# Patient Record
Sex: Male | Born: 1995 | Race: Black or African American | Hispanic: No | Marital: Single | Smoking: Never smoker
Health system: Southern US, Community
[De-identification: ages and names within clinical notes are randomized; demographics above are authoritative.]

## PROBLEM LIST (undated history)

## (undated) DIAGNOSIS — Q059 Spina bifida, unspecified: Secondary | ICD-10-CM

## (undated) DIAGNOSIS — Z982 Presence of cerebrospinal fluid drainage device: Secondary | ICD-10-CM

## (undated) DIAGNOSIS — G8221 Paraplegia, complete: Secondary | ICD-10-CM

## (undated) DIAGNOSIS — N319 Neuromuscular dysfunction of bladder, unspecified: Secondary | ICD-10-CM

## (undated) DIAGNOSIS — M419 Scoliosis, unspecified: Secondary | ICD-10-CM

## (undated) DIAGNOSIS — K592 Neurogenic bowel, not elsewhere classified: Secondary | ICD-10-CM

## (undated) HISTORY — PX: HIP SURGERY: SHX245

## (undated) HISTORY — PX: BACK SURGERY: SHX140

## (undated) HISTORY — PX: BLADDER SURGERY: SHX569

---
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
## 2014-01-25 ENCOUNTER — Emergency Department (HOSPITAL_COMMUNITY)
Admission: EM | Admit: 2014-01-25 | Discharge: 2014-01-25 | Disposition: A | Discharge status: Home / Self Care | Payer: Medicare Other | Attending: Emergency Medicine | Admitting: Emergency Medicine

## 2014-01-25 ENCOUNTER — Emergency Department (HOSPITAL_COMMUNITY): Payer: Medicare Other

## 2014-01-25 ENCOUNTER — Encounter (HOSPITAL_COMMUNITY): Payer: Self-pay | Admitting: Cardiology

## 2014-01-25 DIAGNOSIS — Q059 Spina bifida, unspecified: Secondary | ICD-10-CM | POA: Diagnosis not present

## 2014-01-25 DIAGNOSIS — L84 Corns and callosities: Secondary | ICD-10-CM | POA: Diagnosis not present

## 2014-01-25 DIAGNOSIS — Z79899 Other long term (current) drug therapy: Secondary | ICD-10-CM | POA: Insufficient documentation

## 2014-01-25 DIAGNOSIS — Z5189 Encounter for other specified aftercare: Secondary | ICD-10-CM

## 2014-01-25 DIAGNOSIS — Z792 Long term (current) use of antibiotics: Secondary | ICD-10-CM | POA: Diagnosis not present

## 2014-01-25 DIAGNOSIS — M79671 Pain in right foot: Secondary | ICD-10-CM | POA: Diagnosis present

## 2014-01-25 MED ORDER — SULFAMETHOXAZOLE-TRIMETHOPRIM 800-160 MG PO TABS: 1.0000 | ORAL_TABLET | Freq: Two times a day (BID) | ORAL | Status: DC

## 2014-01-25 NOTE — ED Notes (Signed)
Has a callus to right foot that has busted open.

## 2014-01-25 NOTE — ED Provider Notes (Signed)
CSN: [id number]     Arrival date & time 01/25/14  1052 History  This chart was scribed for non-physician practitioner Burgess AmorJulie Kayah Hecker, PA-C working with James LennertJoseph L Zammit, MD by Murriel HopperAlec Bankhead, ED Scribe. This patient was seen in room APFT22/APFT22 and the patient's care was started at 12:53 PM.    Chief Complaint  Patient presents with  . Foot Pain    The history is provided by the patient. No language interpreter was used.    HPI Comments: James Phoenixicholas Hayne is a 18 y.o. male who presents to the Emergency Department complaining of constant right foot pain on a callus with associated bleeding that started yesterday. He wears braces on his legs due to spina bifida with resultant lower extremity weakness.  Pt notes that he always has a callous on this foot, but says that it has never looked this bad or bled before. There has been no purulent drainage and he reports having no sensation in his legs, therefore denies pain. Pt notes that he [place] a [place]iatrist in [place], and that they trim down his callous a few times per year. Pt denies fever, nausea, and vomiting. Pt denies any existing medical problems or being allergic to any medications. 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    Past Medical History  Diagnosis Date  . Spinal bifida, closed    History reviewed. No pertinent past surgical history. History reviewed. No pertinent family history. History  Substance Use Topics  . Smoking status: Never Smoker   . Smokeless tobacco: Not on file  . Alcohol Use: No    Review of Systems  Constitutional: Negative for fever.  Musculoskeletal: Negative for myalgias and arthralgias.  Skin: Positive for wound.  Neurological: Negative for weakness and numbness.      Allergies  Latex and Peanut-containing drug products  Home Medications   Prior to Admission medications   Medication Sig Start Date End Date Taking? Authorizing Provider  oxybutynin (DITROPAN) 5 MG tablet Take 5 mg by mouth 3 (three)  times daily.   Yes Historical Provider, MD  sulfamethoxazole-trimethoprim (SEPTRA DS) 800-160 MG per tablet Take 1 tablet by mouth every 12 (twelve) hours. 01/25/14   Burgess AmorJulie Hernando Reali, PA-C   BP 132/80 mmHg  Pulse 78  Temp(Src) 98.9 F (37.2 C) (Oral)  Resp 16  Wt 150 lb (68.04 kg)  SpO2 100% Physical Exam  Constitutional: He appears well-developed and well-nourished.  HENT:  Head: Atraumatic.  Neck: Normal range of motion.  Cardiovascular:  Pulses equal bilaterally  Musculoskeletal: He exhibits no edema.  Neurological: He is alert. He has normal strength. He displays normal reflexes. No sensory deficit.  Skin: Skin is warm and dry.  2 cm raised callous  Right medial ankle Fissure with surrounding dried blood No fluctuance or erythema Indurated border with central loose scab Normal dorsalis pedis pulses  Psychiatric: He has a normal mood and affect.    ED Course  Procedures (including critical care time)  DIAGNOSTIC STUDIES: Oxygen Saturation is 100% on RA, normal by my interpretation.    COORDINATION OF CARE: 12:58 PM Discussed treatment plan with pt at bedside and pt agreed to plan.   Labs Review Labs Reviewed - No data to display  Imaging Review Dg Foot Complete Right  01/25/2014   CLINICAL DATA:  18 year old male with spontaneous draining from a chronic callus area at the medial aspect of his right midfoot. Clinical history of spina bifida with no sensation or use of the right foot/extremity.  EXAM: RIGHT  FOOT COMPLETE - 3+ VIEW  COMPARISON:  None.  FINDINGS: No evidence of acute fracture or malalignment. Mild diffuse disuse osteopenia. Hammertoe deformity of the first and second toe. Chronic pes planus. Focal soft tissue swelling along the medial aspect of the foot overlying the tarsal navicular. There appear to be is some linear internal lucencies which may represent subcutaneous emphysema. The no erosive or bony changes in the adjacent osseous structures.  IMPRESSION: 1.  Focal rounded soft tissue swelling with internal linear lucencies overlying the medial aspect of the midfoot at the level of the tarsal navicular. The internal linear lucencies may represent subcutaneous emphysema. Differential considerations include soft tissue infection and potentially epidermoid cyst. 2. Disuse osteopenia. 3. First and second hammertoe deformities. 4. Pes planus.   Electronically Signed   By: Malachy MoanHeath  McCullough M.D.   On: 01/25/2014 12:47     EKG Interpretation None      MDM   Final diagnoses:  Wound check, abscess  Pre-ulcerative corn or callous    Pt encouraged warm epsom salt soaks bid, prescribed septra. F/u with podiatry this week. Pt to call for appt.  Suspect central scab will slough with soaking, will probably require debridement of calloused border.  I personally performed the services described in this documentation, which was scribed in my presence. The recorded information has been reviewed and is accurate.   Burgess AmorJulie Maham Quintin, PA-C 01/26/14 [id number]  James LennertJoseph L Zammit, MD 01/26/14 [phone]

## 2014-01-25 NOTE — Discharge Instructions (Signed)
Corns and Calluses A thickening of the skin layer (usually over bony areas, such as toe joints) is known as a corn. Two types of corns exist: hard corns and soft corns. Calluses are painless areas of skin thickening that are caused by repeated pressure or irritation. Corns tend to affect toe joints and the skin between the toes; whereas, a callus can appear on any part of the body (especially the hands, feet, or knees).  SYMPTOMS   Corn:  Presence of a small (1/8 to 3/8 inch [3 to 10 mm in diameter]), painful bump on the side or over the joint of a toe.  Hard corns are more common on the outer portion of the little (fifth) toe at the joint.  Soft corns are more common between bony bumps (prominences), usually between the fourth and fifth toes or between the second and third toes.  Callus:  A rough, thickened area of skin that appears after repeated pressure or irritation. CAUSES  The purpose of corns and calluses is to protect an area of skin from injury caused by repeated irritation (rubbing or squeezing). The presence of pressure causes the skin cells to grow at a faster rate than the cells of unaffected areas. This leads to an overgrowth (corn or callus). As apposed to hard corns, soft corns tend to develop between toes, because there is more moisture. Soft corns are often the result of prolonged shoe wear, which leads to increased perspiration and moisture.  RISK INCREASES WITH:  Shoes that are too tight.  Occupations or sports that involve repetitive pressure on the hands (racquetball and baseball) or sudden stops on hard surfaces (track and tennis).  Sports that require the athlete to wear shoes, perspire, or wear clothing or protective gear that causes the production of heat and friction. PREVENTION  Properly fitted shoes and equipment.  Modify activities to prevent constant pressure on specific areas of skin.  If possible, wear padding over areas of skin that are exposed to  repeated pressure or irritation.  Keep the area between the toes dry (with powder or by removing shoes often).  Relieve shoe pressure by stretching the areas of the shoe that cause the pressure and or use ointments to soften leather shoes. PROGNOSIS  Corns and calluses typically subside if the activity that causes them is eliminated. Recovery may take up to 3 weeks. Recurrence is likely even with treatment if the cause is not removed.  RELATED COMPLICATIONS  If one overcompensates in an attempt to avoid pain, he or she may experience pain in other areas due to the changes in body movements (mechanics). TREATMENT  The best way to treat corns and calluses is to remove the source of pressure. Corn and callus pads may be helpful in reducing pressure on the affected skin. For soft corns, try to keep the affected area dry. If you cannot find shoes that fit properly, a shoe repair shop may be able to alter your shoes to reduce pressure. Occasionally a cushion for the bottom of the foot (metatarsal bar) worn within the shoe may relieve pressure on corns or calluses of the foot. For calluses, you may be able to peel or rub the thickened area with a pumice stone, sandstone, callus file, or with sandpaper to remove the callus; wetting the affected area may make this process more effective. Do not cut the corn or callus with a razor or knife. If the corn or callus must be removed, then a medically trained person should perform  the procedure. After peeling away the upper layers of a corn once or twice a day, it may be recommended to apply a non-prescription 5% to 10% salicylic ointment and cover the area with a bandage. It very uncommon to have the bony bumps (at toe joints) surgically removed. MEDICATION   If pain medication is necessary, nonsteroidal anti-inflammatory medications, such as aspirin and ibuprofen, or other minor pain relievers, such as acetaminophen, are often recommended. Contact your caregiver  immediately if any bleeding, stomach upset, or signs of an allergic reaction occur.  Topical salicylic ointments (5% to 10%) may be of benefit.  Prescription pain medications may be given by a caregiver. Use only as directed and only as much as you need.  Soak the foot for 20 minutes, twice a day, in a gallon of warm water. This may help to soften corns and calluses. Care should be taken to thoroughly dry the foot, especially between the toes, after soaking. SEEK MEDICAL CARE IF:   Symptoms get worse or do not improve in 2 weeks despite treatment.  Any signs of infection develop, including redness, swelling, increased pain or tenderness, or increased warmth around the corn or callus.  New, unexplained symptoms develop (drugs used in treatment may produce side effects). Document Released: 02/27/2005 Document Revised: 05/22/2011 Document Reviewed: 06/11/2008 [facility] Patient Information 2015 [place] [place]. This information is not intended to replace advice given to you by your health care provider. Make sure you discuss any questions you have with your health care provider.  You have been prescribed an antibiotic, although this site is not clearly infected.  Complete a 10 minute warm epsom salt soak twice daily.  Call your podiatrist for a recheck of your foot this week.

## 2014-01-25 NOTE — ED Notes (Signed)
Julie, PA at bedside.

## 2014-03-12 ENCOUNTER — Emergency Department (HOSPITAL_COMMUNITY)
Admission: EM | Admit: 2014-03-12 | Discharge: 2014-03-12 | Disposition: A | Discharge status: Home / Self Care | Payer: Medicare Other | Attending: Emergency Medicine | Admitting: Emergency Medicine

## 2014-03-12 ENCOUNTER — Encounter (HOSPITAL_COMMUNITY): Payer: Self-pay | Admitting: Cardiology

## 2014-03-12 ENCOUNTER — Ambulatory Visit (HOSPITAL_COMMUNITY): Admission: RE | Admit: 2014-03-12 | Payer: Medicare Other | Source: Ambulatory Visit

## 2014-03-12 ENCOUNTER — Emergency Department (HOSPITAL_COMMUNITY): Payer: Medicare Other

## 2014-03-12 DIAGNOSIS — R52 Pain, unspecified: Secondary | ICD-10-CM

## 2014-03-12 DIAGNOSIS — M7989 Other specified soft tissue disorders: Secondary | ICD-10-CM | POA: Diagnosis not present

## 2014-03-12 DIAGNOSIS — Z79899 Other long term (current) drug therapy: Secondary | ICD-10-CM | POA: Diagnosis not present

## 2014-03-12 DIAGNOSIS — L989 Disorder of the skin and subcutaneous tissue, unspecified: Secondary | ICD-10-CM | POA: Insufficient documentation

## 2014-03-12 DIAGNOSIS — Z91040 Latex allergy status: Secondary | ICD-10-CM | POA: Diagnosis not present

## 2014-03-12 DIAGNOSIS — Q059 Spina bifida, unspecified: Secondary | ICD-10-CM | POA: Diagnosis not present

## 2014-03-12 MED ORDER — IBUPROFEN 800 MG PO TABS: 800.0000 mg | ORAL_TABLET | Freq: Three times a day (TID) | ORAL | Status: DC

## 2014-03-12 NOTE — ED Provider Notes (Signed)
CSN: [id number]     Arrival date & time 03/12/14  1105 History  This chart was scribed for James OctaveStephen Selassie Spatafore, MD by Abel PrestoKara Demonbreun, ED Scribe. This patient was seen in room APA03/APA03 and the patient's care was started at 11:34 AM.    Chief Complaint  Patient presents with  . Leg Swelling    The history is provided by the patient and a relative. No language interpreter was used.   HPI Comments: James Kirk is a 18 y.o. male with PMHx of spina bifida who presents to the Emergency Department complaining of right leg swelling with onset yesterday.  Pt notes associated pain in his right knee and . Pt has folliculitis scabs on both legs. Pt denies any other medical problems. Pt denies chest pain, SOB, abdominal pain, fevers, vomiting, fall or injury, and PMHx of blood clot. 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  [facility]   Past Medical History  Diagnosis Date  . Spinal bifida, closed    History reviewed. No pertinent past surgical history. History reviewed. No pertinent family history. History  Substance Use Topics  . Smoking status: Never Smoker   . Smokeless tobacco: Not on file  . Alcohol Use: No    Review of Systems  Constitutional: Negative for fever.  Respiratory: Negative for shortness of breath.   Cardiovascular: Negative for chest pain.  Gastrointestinal: Negative for nausea, vomiting and abdominal pain.  Musculoskeletal: Positive for myalgias.  Skin: Positive for rash.   A complete 10 system review of systems was obtained and all systems are negative except as noted in the HPI and PMH.     Allergies  Latex and Peanut-containing drug products  Home Medications   Prior to Admission medications   Medication Sig Start Date End Date Taking? Authorizing Provider  oxybutynin (DITROPAN) 5 MG tablet Take 5 mg by mouth 3 (three) times daily.   Yes Historical Provider, MD  ibuprofen (ADVIL,MOTRIN) 800 MG tablet Take 1 tablet (800 mg  total) by mouth 3 (three) times daily. 03/12/14   James OctaveStephen Ved Martos, MD  sulfamethoxazole-trimethoprim (SEPTRA DS) 800-160 MG per tablet Take 1 tablet by mouth every 12 (twelve) hours. Patient not taking: Reported on 03/12/2014 01/25/14   [name], PA-C   BP 135/85 mmHg  Pulse 85  Temp(Src) 98.3 F (36.8 C) (Oral)  Resp 20  Wt 150 lb (68.04 kg)  SpO2 100% Physical Exam  Constitutional: He is oriented to person, place, and time. He appears well-developed and well-nourished. No distress.  HENT:  Head: Normocephalic and atraumatic.  Mouth/Throat: Oropharynx is clear and moist. No oropharyngeal exudate.  Eyes: Conjunctivae and EOM are normal. Pupils are equal, round, and reactive to light.  Neck: Normal range of motion. Neck supple.  No meningismus.  Cardiovascular: Normal rate, regular rhythm, normal heart sounds and intact distal pulses.   No murmur heard. Intact DP and PT pulses  Pulmonary/Chest: Effort normal and breath sounds normal. No respiratory distress.  Abdominal: Soft. There is no tenderness. There is no rebound and no guarding.  Musculoskeletal: Normal range of motion. He exhibits tenderness. He exhibits no edema.       Right knee: Tenderness (posterior knee and proximal calf) found.  Bilateral lower extremity atrophy and weakness consistent with spinal bifida Right leg appears larger than left  Neurological: He is alert and oriented to person, place, and time. No cranial nerve deficit. He exhibits normal muscle tone. Coordination normal.  Lower extremity weakness at  baseline  Skin: Skin is warm. Lesion (Multiple old appearing scab lesions to bilateral legs) noted.  Psychiatric: He has a normal mood and affect. His behavior is normal.  Nursing note and vitals reviewed.   ED Course  Procedures (including critical care time) DIAGNOSTIC STUDIES: Oxygen Saturation is 99% on room air, normal by my interpretation.    COORDINATION OF CARE: 11:38 AM Discussed treatment plan  with patient at beside, the patient agrees with the plan and has no further questions at this time.   Labs Review Labs Reviewed - No data to display  Imaging Review [place] Venous Img Lower Unilateral Right  03/12/2014   CLINICAL DATA:  Right lower extremity pain and edema. History of spina bifida.  EXAM: RIGHT LOWER EXTREMITY VENOUS DOPPLER ULTRASOUND  TECHNIQUE: Gray-scale sonography with graded compression, as well as color Doppler and duplex ultrasound were performed to evaluate the lower extremity deep venous systems from the level of the common femoral vein and including the common femoral, femoral, profunda femoral, popliteal and calf veins including the posterior tibial, peroneal and gastrocnemius veins when visible. The superficial great saphenous vein was also interrogated. Spectral Doppler was utilized to evaluate flow at rest and with distal augmentation maneuvers in the common femoral, femoral and popliteal veins.  COMPARISON:  None.  FINDINGS: Contralateral Common Femoral Vein: Respiratory phasicity is normal and symmetric with the symptomatic side. No evidence of thrombus. Normal compressibility.  Common Femoral Vein: No evidence of thrombus. Normal compressibility, respiratory phasicity and response to augmentation.  Saphenofemoral Junction: No evidence of thrombus. Normal compressibility and flow on color Doppler imaging.  Profunda Femoral Vein: No evidence of thrombus. Normal compressibility and flow on color Doppler imaging.  Femoral Vein: No evidence of thrombus. Normal compressibility, respiratory phasicity and response to augmentation.  Popliteal Vein: No evidence of thrombus. Normal compressibility, respiratory phasicity and response to augmentation.  Calf Veins: No evidence of thrombus. Normal compressibility and flow on color Doppler imaging.  Superficial Great Saphenous Vein: No evidence of thrombus. Normal compressibility and flow on color Doppler imaging.  Venous Reflux:  None.  Other  Findings: No evidence of superficial thrombophlebitis or abnormal fluid collection.  IMPRESSION: No evidence of right lower extremity deep venous thrombosis.   Electronically Signed   By: Irish LackGlenn  Yamagata M.D.   On: 03/12/2014 13:45     EKG Interpretation None      MDM   Final diagnoses:  Leg swelling  Pain   2 day history of right leg swelling. Denies trauma. History of spina bifida and is mostly wheelchair-bound. No chest pain or SOB. Intact distal pulses.  Ultrasound negative for DVT. Patient and mother deny any trauma and refuse Xray.  Consider given patient's lack of sensation in leg at baseline.  Supportive care and follow up with PCP.  James OctaveStephen Abdulah Iqbal, MD 03/12/14 [phone]

## 2014-03-12 NOTE — ED Notes (Signed)
Ultrasound at bedside

## 2014-03-12 NOTE — Discharge Instructions (Signed)
Peripheral Edema There is no blood clot. Take anti-inflammatory medication as prescribed. Follow-up with your doctor. Return to the ED if you develop chest pain, shortness of breath or other symptoms. You have swelling in your legs (peripheral edema). This swelling is due to excess accumulation of salt and water in your body. Edema may be a sign of heart, kidney or liver disease, or a side effect of a medication. It may also be due to problems in the leg veins. Elevating your legs and using special support stockings may be very helpful, if the cause of the swelling is due to poor venous circulation. Avoid long periods of standing, whatever the cause. Treatment of edema depends on identifying the cause. Chips, pretzels, pickles and other salty foods should be avoided. Restricting salt in your diet is almost always needed. Water pills (diuretics) are often used to remove the excess salt and water from your body via urine. These medicines prevent the kidney from reabsorbing sodium. This increases urine flow. Diuretic treatment may also result in lowering of potassium levels in your body. Potassium supplements may be needed if you have to use diuretics daily. Daily weights can help you keep track of your progress in clearing your edema. You should call your caregiver for follow up care as recommended. SEEK IMMEDIATE MEDICAL CARE IF:   You have increased swelling, pain, redness, or heat in your legs.  You develop shortness of breath, especially when lying down.  You develop chest or abdominal pain, weakness, or fainting.  You have a fever. Document Released: 04/06/2004 Document Revised: 05/22/2011 Document Reviewed: 03/17/2009 [facility] Patient Information 2015 [place], [place]. This information is not intended to replace advice given to you by your health care provider. Make sure you discuss any questions you have with your health care provider.

## 2014-03-12 NOTE — ED Notes (Signed)
Patient states he did not fall or injure leg and does not want to have an xray done. EDP aware.

## 2014-03-12 NOTE — ED Notes (Signed)
Right leg swelling since yesterday

## 2014-07-26 ENCOUNTER — Encounter (HOSPITAL_COMMUNITY): Payer: Self-pay | Admitting: Emergency Medicine

## 2014-07-26 ENCOUNTER — Emergency Department (HOSPITAL_COMMUNITY): Payer: Medicare Other

## 2014-07-26 ENCOUNTER — Emergency Department (HOSPITAL_COMMUNITY)
Admission: EM | Admit: 2014-07-26 | Discharge: 2014-07-26 | Disposition: A | Discharge status: Home / Self Care | Payer: Medicare Other | Attending: Emergency Medicine | Admitting: Emergency Medicine

## 2014-07-26 DIAGNOSIS — Z87728 Personal history of other specified (corrected) congenital malformations of nervous system and sense organs: Secondary | ICD-10-CM | POA: Diagnosis not present

## 2014-07-26 DIAGNOSIS — Z91040 Latex allergy status: Secondary | ICD-10-CM | POA: Diagnosis not present

## 2014-07-26 DIAGNOSIS — J069 Acute upper respiratory infection, unspecified: Secondary | ICD-10-CM | POA: Diagnosis not present

## 2014-07-26 DIAGNOSIS — M791 Myalgia: Secondary | ICD-10-CM | POA: Insufficient documentation

## 2014-07-26 DIAGNOSIS — J189 Pneumonia, unspecified organism: Secondary | ICD-10-CM

## 2014-07-26 DIAGNOSIS — Z79899 Other long term (current) drug therapy: Secondary | ICD-10-CM | POA: Insufficient documentation

## 2014-07-26 DIAGNOSIS — R0981 Nasal congestion: Secondary | ICD-10-CM | POA: Diagnosis present

## 2014-07-26 DIAGNOSIS — J159 Unspecified bacterial pneumonia: Secondary | ICD-10-CM | POA: Diagnosis not present

## 2014-07-26 MED ORDER — AZITHROMYCIN 250 MG PO TABS
250.0000 mg | ORAL_TABLET | Freq: Every day | ORAL | Status: DC
Start: 2014-07-26 — End: 2016-03-09

## 2014-07-26 MED ORDER — BENZONATATE 100 MG PO CAPS: 100.0000 mg | ORAL_CAPSULE | Freq: Three times a day (TID) | ORAL | Status: DC | PRN

## 2014-07-26 MED ORDER — AZITHROMYCIN 250 MG PO TABS
500.0000 mg | ORAL_TABLET | Freq: Once | ORAL | Status: AC
  Administered 2014-07-26: 500 mg via ORAL
  Filled 2014-07-26: qty 2

## 2014-07-26 NOTE — ED Notes (Signed)
PT c/o nasal congestion, sorethroat, body aches with dry cough x1 week.

## 2014-07-26 NOTE — Discharge Instructions (Signed)
Pneumonia, Adult Pneumonia is an infection of the lungs. It may be caused by a germ (virus or bacteria). Some types of pneumonia can spread easily from person to person. This can happen when you cough or sneeze. HOME CARE  Only take medicine as told by your doctor.  Take your medicine (antibiotics) as told. Finish it even if you start to feel better.  Do not smoke.  You may use a vaporizer or humidifier in your room. This can help loosen thick spit (mucus).  Sleep so you are almost sitting up (semi-upright). This helps reduce coughing.  Rest. A shot (vaccine) can help prevent pneumonia. Shots are often advised for:  People over 21 years old.  Patients on chemotherapy.  People with long-term (chronic) lung problems.  People with immune system problems. GET HELP RIGHT AWAY IF:   You are getting worse.  You cannot control your cough, and you are losing sleep.  You cough up blood.  Your pain gets worse, even with medicine.  You have a fever.  Any of your problems are getting worse, not better.  You have shortness of breath or chest pain. MAKE SURE YOU:   Understand these instructions.  Will watch your condition.  Will get help right away if you are not doing well or get worse. Document Released: 08/16/2007 Document Revised: 05/22/2011 Document Reviewed: 05/20/2010 [facility] Patient Information 2015 [employer], [place]. This information is not intended to replace advice given to you by your health care provider. Make sure you discuss any questions you have with your health care provider.  Upper Respiratory Infection, Adult An upper respiratory infection (URI) is also known as the common cold. It is often caused by a type of germ (virus). Colds are easily spread (contagious). You can pass it to others by kissing, coughing, sneezing, or drinking out of the same glass. Usually, you get better in 1 or 2 weeks.  HOME CARE   Only take medicine as told by your doctor.  Use a  warm mist humidifier or breathe in steam from a hot shower.  Drink enough water and fluids to keep your pee (urine) clear or pale yellow.  Get plenty of rest.  Return to work when your temperature is back to normal or as told by your doctor. You may use a face mask and wash your hands to stop your cold from spreading. GET HELP RIGHT AWAY IF:   After the first few days, you feel you are getting worse.  You have questions about your medicine.  You have chills, shortness of breath, or brown or red spit (mucus).  You have yellow or brown snot (nasal discharge) or pain in the face, especially when you bend forward.  You have a fever, puffy (swollen) neck, pain when you swallow, or white spots in the back of your throat.  You have a bad headache, ear pain, sinus pain, or chest pain.  You have a high-pitched whistling sound when you breathe in and out (wheezing).  You have a lasting cough or cough up blood.  You have sore muscles or a stiff neck. MAKE SURE YOU:   Understand these instructions.  Will watch your condition.  Will get help right away if you are not doing well or get worse. Document Released: 08/16/2007 Document Revised: 05/22/2011 Document Reviewed: 06/04/2013 [facility] Patient Information 2015 [place], [place]. This information is not intended to replace advice given to you by your health care provider. Make sure you discuss any questions you have with your  health care provider. ° °

## 2014-07-26 NOTE — ED Provider Notes (Signed)
CSN: [id number]     Arrival date & time 07/26/14  1024 History   First MD Initiated Contact with Patient 07/26/14 1043     Chief Complaint  Patient presents with  . URI     (Consider location/radiation/quality/duration/timing/severity/associated sxs/prior Treatment) HPI  James Kirk is a 19 y.o. male who presents to the Emergency Department complaining of nasal congestion, sore throat, general body aches and cough for one week.  States the cough has been non-productive and worse at night, runny nose and sore throat.  He has been using OTC cold medications without relief. Denies chest pain, shortness of breath, fever, chills.     Past Medical History  Diagnosis Date  . Spinal bifida, closed    Past Surgical History  Procedure Laterality Date  . Back surgery     History reviewed. No pertinent family history. History  Substance Use Topics  . Smoking status: Never Smoker   . Smokeless tobacco: Not on file  . Alcohol Use: No    Review of Systems  Constitutional: Negative for fever, chills and appetite change.  HENT: Positive for congestion, rhinorrhea and sore throat. Negative for ear pain and trouble swallowing.   Respiratory: Positive for cough. Negative for chest tightness, shortness of breath and wheezing.   Cardiovascular: Negative for chest pain.  Gastrointestinal: Negative for nausea, vomiting and abdominal pain.  Genitourinary: Negative for dysuria and frequency.  Musculoskeletal: Positive for myalgias. Negative for back pain, arthralgias, neck pain and neck stiffness.  Skin: Negative for rash.  Neurological: Negative for dizziness, weakness and numbness.  Hematological: Negative for adenopathy.  All other systems reviewed and are negative.     Allergies  Latex and Peanut-containing drug products  Home Medications   Prior to Admission medications   Medication Sig Start Date End Date Taking? Authorizing Provider  cetirizine (ZYRTEC) 10 MG tablet Take 10  mg by mouth daily.   Yes Historical Provider, MD  oxybutynin (DITROPAN-XL) 5 MG 24 hr tablet  07/13/14  Yes Historical Provider, MD  ibuprofen (ADVIL,MOTRIN) 800 MG tablet Take 1 tablet (800 mg total) by mouth 3 (three) times daily. Patient not taking: Reported on 07/26/2014 03/12/14   Glynn OctaveStephen Rancour, MD  sulfamethoxazole-trimethoprim (SEPTRA DS) 800-160 MG per tablet Take 1 tablet by mouth every 12 (twelve) hours. Patient not taking: Reported on 03/12/2014 01/25/14   [name], PA-C   BP 164/102 mmHg  Pulse 114  Temp(Src) 97.9 F (36.6 C) (Axillary)  Resp 16  Wt 150 lb (68.04 kg)  SpO2 97% Physical Exam  Constitutional: He is oriented to person, place, and time. He appears well-developed and well-nourished. No distress.  HENT:  Head: Normocephalic and atraumatic.  Mouth/Throat: Uvula is midline. Mucous membranes are dry. Posterior oropharyngeal erythema present. No oropharyngeal exudate, posterior oropharyngeal edema or tonsillar abscesses.  Cardiovascular: Normal rate, regular rhythm, normal heart sounds and intact distal pulses.   No murmur heard. Pulmonary/Chest: Effort normal and breath sounds normal. No respiratory distress.  Musculoskeletal: Normal range of motion.  Neurological: He is alert and oriented to person, place, and time. Coordination normal.  Skin: Skin is warm and dry.  Nursing note and vitals reviewed.   ED Course  Procedures (including critical care time) Labs Review Labs Reviewed - No data to display  Imaging Review Dg Chest 2 View  07/26/2014   CLINICAL DATA:  Cough and congestion.  EXAM: CHEST  2 VIEW  COMPARISON:  None.  FINDINGS: The cardiomediastinal silhouette is unremarkable.  Left lower lobe airspace opacities  compatible with pneumonia.  There is no evidence of pneumothorax, pleural effusion, nodule, mass or acute bony abnormality.  Spinal fixation rods and thoracolumbar scoliosis identified.  IMPRESSION: Left lower lobe airspace disease/ pneumonia.    Electronically Signed   By: Harmon PierJeffrey  Hu M.D.   On: 07/26/2014 11:50     EKG Interpretation None      MDM   Final diagnoses:  Community acquired pneumonia  URI (upper respiratory infection)    Pt is well appearing, non-toxic  VSS.  Clinical suspicion for PE is low.    Pt has drank po fluids.  Feeling better.  Will treat with antibiotic and he agrees to close f/u with PMD. Given strict return precautions.     Rosey Bathammy Terecia Plaut, PA-C 07/27/14 2108  Glynn OctaveStephen Rancour, MD 07/28/14 1000

## 2014-07-26 NOTE — ED Notes (Signed)
Pt tolerating po fluids well, pt and family updated on plan of care,

## 2014-09-03 DIAGNOSIS — N319 Neuromuscular dysfunction of bladder, unspecified: Secondary | ICD-10-CM | POA: Diagnosis present

## 2014-09-03 DIAGNOSIS — K592 Neurogenic bowel, not elsewhere classified: Secondary | ICD-10-CM | POA: Insufficient documentation

## 2015-09-30 ENCOUNTER — Ambulatory Visit (HOSPITAL_COMMUNITY)
Admission: RE | Admit: 2015-09-30 | Discharge: 2015-09-30 | Disposition: A | Discharge status: Home / Self Care | Payer: Medicare Other | Source: Ambulatory Visit | Attending: Internal Medicine | Admitting: Internal Medicine

## 2015-09-30 ENCOUNTER — Other Ambulatory Visit (HOSPITAL_COMMUNITY): Payer: Self-pay | Admitting: Internal Medicine

## 2015-09-30 DIAGNOSIS — R600 Localized edema: Secondary | ICD-10-CM | POA: Insufficient documentation

## 2016-03-09 ENCOUNTER — Inpatient Hospital Stay (HOSPITAL_COMMUNITY)
Admission: EM | Admit: 2016-03-09 | Discharge: 2016-03-11 | DRG: 872 | Disposition: A | Discharge status: Home / Self Care | Payer: Medicare Other | Attending: Internal Medicine | Admitting: Internal Medicine

## 2016-03-09 ENCOUNTER — Emergency Department (HOSPITAL_COMMUNITY): Payer: Medicare Other

## 2016-03-09 ENCOUNTER — Encounter (HOSPITAL_COMMUNITY): Payer: Self-pay | Admitting: *Deleted

## 2016-03-09 DIAGNOSIS — E876 Hypokalemia: Secondary | ICD-10-CM | POA: Diagnosis present

## 2016-03-09 DIAGNOSIS — N3000 Acute cystitis without hematuria: Secondary | ICD-10-CM | POA: Diagnosis present

## 2016-03-09 DIAGNOSIS — A419 Sepsis, unspecified organism: Secondary | ICD-10-CM | POA: Diagnosis not present

## 2016-03-09 DIAGNOSIS — K5901 Slow transit constipation: Secondary | ICD-10-CM | POA: Diagnosis not present

## 2016-03-09 DIAGNOSIS — K5909 Other constipation: Secondary | ICD-10-CM | POA: Diagnosis present

## 2016-03-09 DIAGNOSIS — Q051 Thoracic spina bifida with hydrocephalus: Secondary | ICD-10-CM

## 2016-03-09 DIAGNOSIS — N319 Neuromuscular dysfunction of bladder, unspecified: Secondary | ICD-10-CM

## 2016-03-09 DIAGNOSIS — E872 Acidosis: Secondary | ICD-10-CM | POA: Diagnosis present

## 2016-03-09 DIAGNOSIS — Q054 Unspecified spina bifida with hydrocephalus: Secondary | ICD-10-CM | POA: Diagnosis not present

## 2016-03-09 DIAGNOSIS — Z23 Encounter for immunization: Secondary | ICD-10-CM

## 2016-03-09 DIAGNOSIS — Z91040 Latex allergy status: Secondary | ICD-10-CM | POA: Diagnosis not present

## 2016-03-09 DIAGNOSIS — Z982 Presence of cerebrospinal fluid drainage device: Secondary | ICD-10-CM

## 2016-03-09 DIAGNOSIS — Z91010 Allergy to peanuts: Secondary | ICD-10-CM

## 2016-03-09 DIAGNOSIS — B9620 Unspecified Escherichia coli [E. coli] as the cause of diseases classified elsewhere: Secondary | ICD-10-CM | POA: Diagnosis present

## 2016-03-09 DIAGNOSIS — Q059 Spina bifida, unspecified: Secondary | ICD-10-CM

## 2016-03-09 DIAGNOSIS — Q052 Lumbar spina bifida with hydrocephalus: Secondary | ICD-10-CM

## 2016-03-09 DIAGNOSIS — G8221 Paraplegia, complete: Secondary | ICD-10-CM | POA: Diagnosis not present

## 2016-03-09 DIAGNOSIS — R1013 Epigastric pain: Secondary | ICD-10-CM | POA: Diagnosis not present

## 2016-03-09 HISTORY — DX: Slow transit constipation: K59.01

## 2016-03-09 HISTORY — DX: Paraplegia, complete: G82.21

## 2016-03-09 HISTORY — DX: Neurogenic bowel, not elsewhere classified: K59.2

## 2016-03-09 HISTORY — DX: Presence of cerebrospinal fluid drainage device: Z98.2

## 2016-03-09 HISTORY — DX: Neuromuscular dysfunction of bladder, unspecified: N31.9

## 2016-03-09 HISTORY — DX: Scoliosis, unspecified: M41.9

## 2016-03-09 HISTORY — DX: Acute cystitis without hematuria: N30.00

## 2016-03-09 HISTORY — DX: Hypokalemia: E87.6

## 2016-03-09 LAB — I-STAT CHEM 8, ED
BUN: 14 mg/dL (ref 6–20)
Calcium, Ion: 1.09 mmol/L — ABNORMAL LOW (ref 1.15–1.40)
Chloride: 104 mmol/L (ref 101–111)
Creatinine, Ser: 0.7 mg/dL (ref 0.61–1.24)
GLUCOSE: 72 mg/dL (ref 65–99)
HCT: 48 % (ref 39.0–52.0)
HEMOGLOBIN: 16.3 g/dL (ref 13.0–17.0)
POTASSIUM: 3.5 mmol/L (ref 3.5–5.1)
SODIUM: 138 mmol/L (ref 135–145)
TCO2: 26 mmol/L (ref 0–100)

## 2016-03-09 LAB — URINALYSIS, MICROSCOPIC (REFLEX)

## 2016-03-09 LAB — CBC
HCT: 45.1 % (ref 39.0–52.0)
HEMOGLOBIN: 15.4 g/dL (ref 13.0–17.0)
MCH: 30.4 pg (ref 26.0–34.0)
MCHC: 34.1 g/dL (ref 30.0–36.0)
MCV: 89.1 fL (ref 78.0–100.0)
Platelets: 133 10*3/uL — ABNORMAL LOW (ref 150–400)
RBC: 5.06 MIL/uL (ref 4.22–5.81)
RDW: 13 % (ref 11.5–15.5)
WBC: 24.4 10*3/uL — AB (ref 4.0–10.5)

## 2016-03-09 LAB — COMPREHENSIVE METABOLIC PANEL
ALT: 19 U/L (ref 17–63)
ANION GAP: 12 (ref 5–15)
AST: 18 U/L (ref 15–41)
Albumin: 4.8 g/dL (ref 3.5–5.0)
Alkaline Phosphatase: 112 U/L (ref 38–126)
BUN: 13 mg/dL (ref 6–20)
CALCIUM: 9.6 mg/dL (ref 8.9–10.3)
CHLORIDE: 99 mmol/L — AB (ref 101–111)
CO2: 25 mmol/L (ref 22–32)
Creatinine, Ser: 0.7 mg/dL (ref 0.61–1.24)
Glucose, Bld: 102 mg/dL — ABNORMAL HIGH (ref 65–99)
Potassium: 3.2 mmol/L — ABNORMAL LOW (ref 3.5–5.1)
SODIUM: 136 mmol/L (ref 135–145)
Total Bilirubin: 1.1 mg/dL (ref 0.3–1.2)
Total Protein: 8.9 g/dL — ABNORMAL HIGH (ref 6.5–8.1)

## 2016-03-09 LAB — I-STAT CG4 LACTIC ACID, ED: LACTIC ACID, VENOUS: 4.16 mmol/L — AB (ref 0.5–1.9)

## 2016-03-09 LAB — URINALYSIS, ROUTINE W REFLEX MICROSCOPIC
Bilirubin Urine: NEGATIVE
Glucose, UA: NEGATIVE mg/dL
Ketones, ur: 15 mg/dL — AB
Nitrite: POSITIVE — AB
Protein, ur: 30 mg/dL — AB
SPECIFIC GRAVITY, URINE: 1.02 (ref 1.005–1.030)
pH: 6.5 (ref 5.0–8.0)

## 2016-03-09 LAB — APTT: APTT: 35 s (ref 24–36)

## 2016-03-09 LAB — PROTIME-INR
INR: 1.33
PROTHROMBIN TIME: 16.5 s — AB (ref 11.4–15.2)

## 2016-03-09 LAB — LIPASE, BLOOD: LIPASE: 20 U/L (ref 11–51)

## 2016-03-09 MED ORDER — SULFAMETHOXAZOLE-TRIMETHOPRIM 800-160 MG PO TABS
1.0000 | ORAL_TABLET | Freq: Once | ORAL | Status: AC
  Administered 2016-03-09: 1 via ORAL
  Filled 2016-03-09: qty 1

## 2016-03-09 MED ORDER — IOPAMIDOL (ISOVUE-300) INJECTION 61%
INTRAVENOUS | Status: AC
  Administered 2016-03-09: 30 mL
  Filled 2016-03-09: qty 30

## 2016-03-09 MED ORDER — POLYETHYLENE GLYCOL 3350 17 G PO PACK: 17.0000 g | PACK | Freq: Every day | ORAL | Status: DC

## 2016-03-09 MED ORDER — SODIUM CHLORIDE 0.9 % IV BOLUS (SEPSIS)
250.0000 mL | Freq: Once | INTRAVENOUS | Status: AC
  Administered 2016-03-09: 250 mL via INTRAVENOUS

## 2016-03-09 MED ORDER — INFLUENZA VAC SPLIT QUAD 0.5 ML IM SUSY
0.5000 mL | PREFILLED_SYRINGE | INTRAMUSCULAR | Status: AC
  Administered 2016-03-10: 0.5 mL via INTRAMUSCULAR
  Filled 2016-03-09: qty 0.5

## 2016-03-09 MED ORDER — SODIUM CHLORIDE 0.9 % IV BOLUS (SEPSIS)
1000.0000 mL | Freq: Once | INTRAVENOUS | Status: AC
  Administered 2016-03-09: 1000 mL via INTRAVENOUS

## 2016-03-09 MED ORDER — TRAMADOL HCL 50 MG PO TABS: 50.0000 mg | ORAL_TABLET | Freq: Four times a day (QID) | ORAL | Status: DC | PRN

## 2016-03-09 MED ORDER — SODIUM CHLORIDE 0.9 % IV SOLN
INTRAVENOUS | Status: DC
  Administered 2016-03-09 – 2016-03-11 (×3): via INTRAVENOUS

## 2016-03-09 MED ORDER — DEXTROSE 5 % IV SOLN
1.0000 g | INTRAVENOUS | Status: DC
  Administered 2016-03-10: 1 g via INTRAVENOUS
  Filled 2016-03-09 (×4): qty 10

## 2016-03-09 MED ORDER — POTASSIUM CHLORIDE CRYS ER 20 MEQ PO TBCR
40.0000 meq | EXTENDED_RELEASE_TABLET | Freq: Once | ORAL | Status: AC
  Administered 2016-03-09: 40 meq via ORAL
  Filled 2016-03-09: qty 2

## 2016-03-09 MED ORDER — ENOXAPARIN SODIUM 40 MG/0.4ML ~~LOC~~ SOLN
40.0000 mg | SUBCUTANEOUS | Status: DC
  Administered 2016-03-09 – 2016-03-10 (×2): 40 mg via SUBCUTANEOUS
  Filled 2016-03-09 (×2): qty 0.4

## 2016-03-09 MED ORDER — POLYETHYLENE GLYCOL 3350 17 G PO PACK: 17.0000 g | PACK | Freq: Every day | ORAL | 0 refills | Status: AC

## 2016-03-09 MED ORDER — POLYETHYLENE GLYCOL 3350 17 G PO PACK
17.0000 g | PACK | Freq: Every day | ORAL | Status: DC
  Administered 2016-03-09 – 2016-03-11 (×3): 17 g via ORAL
  Filled 2016-03-09 (×3): qty 1

## 2016-03-09 MED ORDER — IOPAMIDOL (ISOVUE-300) INJECTION 61%
100.0000 mL | Freq: Once | INTRAVENOUS | Status: AC | PRN
  Administered 2016-03-09: 100 mL via INTRAVENOUS

## 2016-03-09 MED ORDER — LORATADINE 10 MG PO TABS
10.0000 mg | ORAL_TABLET | Freq: Every day | ORAL | Status: DC
  Administered 2016-03-10 – 2016-03-11 (×2): 10 mg via ORAL
  Filled 2016-03-09 (×2): qty 1

## 2016-03-09 MED ORDER — DEXTROSE 5 % IV SOLN
2.0000 g | Freq: Once | INTRAVENOUS | Status: AC
  Administered 2016-03-09: 2 g via INTRAVENOUS
  Filled 2016-03-09: qty 2

## 2016-03-09 NOTE — ED Notes (Signed)
This RN took over care for this pt at this time.

## 2016-03-09 NOTE — ED Provider Notes (Addendum)
[facility]-EMERGENCY DEPT Provider Note   CSN: [id number] Arrival date & time: 03/09/16  1253     History   Chief Complaint Chief Complaint  Patient presents with  . Abdominal Pain    HPI James Kirk is a 20 y.o. male.Complains of epigastric pain onset 2 days ago, nonradiating pain is mild at present. He's vomited 45 times since onset of pain. No hematemesis. He's also been treated with an enema, which she uses approximately twice per week to have him have a bowel movement . Enema had minimal bowel movement. He denies urinary symptoms he is chronically incontinent of bowel and bladder. He does not feels bladder is full at present. No fever. He feels improved today over yesterday. No nausea present. [facility] and had abdominal x-rays which she was concerned about. Sent here for further evaluation. No other associated symptoms.   HPI  Past Medical History:  Diagnosis Date  . Spinal bifida, closed     There are no active problems to display for this patient.   Past Surgical History:  Procedure Laterality Date  . BACK SURGERY    . BLADDER SURGERY    . HIP SURGERY         Home Medications    Prior to Admission medications   Medication Sig Start Date End Date Taking? Authorizing Provider  azithromycin (ZITHROMAX) 250 MG tablet Take 1 tablet (250 mg total) by mouth daily. Take first 2 tablets together, then 1 every day until finished. 07/26/14   Tammy Triplett, PA-C  benzonatate (TESSALON) 100 MG capsule Take 1 capsule (100 mg total) by mouth 3 (three) times daily as needed for cough. Swallow whole, do not chew 07/26/14   Tammy Triplett, PA-C  cetirizine (ZYRTEC) 10 MG tablet Take 10 mg by mouth daily.    Historical Provider, MD  oxybutynin (DITROPAN-XL) 5 MG 24 hr tablet  07/13/14   Historical Provider, MD    Family History No family history on file.  Social History Social History  Substance Use Topics  . Smoking status:  Never Smoker  . Smokeless tobacco: Never Used  . Alcohol use No     Allergies   Latex and Peanut-containing drug products   Review of Systems Review of Systems  Constitutional: Negative.   HENT: Negative.   Respiratory: Negative.   Cardiovascular: Negative.   Gastrointestinal: Positive for abdominal pain and vomiting. Negative for abdominal distention.       Epigastric pain  Musculoskeletal: Positive for gait problem.  Skin: Negative.   Psychiatric/Behavioral: Negative.   All other systems reviewed and are negative.    Physical Exam Updated Vital Signs BP 140/80 (BP Location: Left Arm)   Pulse (!) 128   Temp 98.6 F (37 C) (Oral)   Resp 18   Wt 150 lb (68 kg)   SpO2 100%   Physical Exam  Constitutional: He appears well-developed and well-nourished. No distress.  HENT:  Head: Normocephalic and atraumatic.  Eyes: Conjunctivae are normal. Pupils are equal, round, and reactive to light.  Neck: Neck supple. No tracheal deviation present. No thyromegaly present.  Cardiovascular: Normal rate and regular rhythm.   No murmur heard. Pulmonary/Chest: Effort normal and breath sounds normal.  Abdominal: Soft. Bowel sounds are normal. He exhibits no distension. There is no tenderness.  Midline surgical scar  Genitourinary: Penis normal. No penile tenderness.  Musculoskeletal: He exhibits no edema or tenderness.  Muscular atrophy bilateral lower extremities  Neurological: He is alert.  Coordination normal.  Skin: Skin is warm and dry. No rash noted.  Psychiatric: He has a normal mood and affect.  Nursing note and vitals reviewed.    ED Treatments / Results  Labs (all labs ordered are listed, but only abnormal results are displayed) Labs Reviewed  LIPASE, BLOOD  COMPREHENSIVE METABOLIC PANEL  CBC  URINALYSIS, ROUTINE W REFLEX MICROSCOPIC    EKG  EKG Interpretation None      Results for orders placed or performed during the hospital encounter of 03/09/16    Lipase, blood  Result Value Ref Range   Lipase 20 11 - 51 U/L  Comprehensive metabolic panel  Result Value Ref Range   Sodium 136 135 - 145 mmol/L   Potassium 3.2 (L) 3.5 - 5.1 mmol/L   Chloride 99 (L) 101 - 111 mmol/L   CO2 25 22 - 32 mmol/L   Glucose, Bld 102 (H) 65 - 99 mg/dL   BUN 13 6 - 20 mg/dL   Creatinine, Ser 1.61 0.61 - 1.24 mg/dL   Calcium 9.6 8.9 - 09.6 mg/dL   Total Protein 8.9 (H) 6.5 - 8.1 g/dL   Albumin 4.8 3.5 - 5.0 g/dL   AST 18 15 - 41 U/L   ALT 19 17 - 63 U/L   Alkaline Phosphatase 112 38 - 126 U/L   Total Bilirubin 1.1 0.3 - 1.2 mg/dL   GFR calc non Af Amer >60 >60 mL/min   GFR calc Af Amer >60 >60 mL/min   Anion gap 12 5 - 15  CBC  Result Value Ref Range   WBC 24.4 (H) 4.0 - 10.5 K/uL   RBC 5.06 4.22 - 5.81 MIL/uL   Hemoglobin 15.4 13.0 - 17.0 g/dL   HCT 04.5 40.9 - 81.1 %   MCV 89.1 78.0 - 100.0 fL   MCH 30.4 26.0 - 34.0 pg   MCHC 34.1 30.0 - 36.0 g/dL   RDW 91.4 78.2 - 95.6 %   Platelets 133 (L) 150 - 400 K/uL  Urinalysis, Routine w reflex microscopic  Result Value Ref Range   Color, Urine YELLOW YELLOW   APPearance TURBID (A) CLEAR   Specific Gravity, Urine 1.020 1.005 - 1.030   pH 6.5 5.0 - 8.0   Glucose, UA NEGATIVE NEGATIVE mg/dL   Hgb urine dipstick TRACE (A) NEGATIVE   Bilirubin Urine NEGATIVE NEGATIVE   Ketones, ur 15 (A) NEGATIVE mg/dL   Protein, ur 30 (A) NEGATIVE mg/dL   Nitrite POSITIVE (A) NEGATIVE   Leukocytes, UA MODERATE (A) NEGATIVE  Urinalysis, Microscopic (reflex)  Result Value Ref Range   RBC / HPF 0-5 0 - 5 RBC/hpf   WBC, UA 6-30 0 - 5 WBC/hpf   Bacteria, UA MANY (A) NONE SEEN   Squamous Epithelial / LPF 0-5 (A) NONE SEEN   Mucous PRESENT   I-stat chem 8, ed  Result Value Ref Range   Sodium 138 135 - 145 mmol/L   Potassium 3.5 3.5 - 5.1 mmol/L   Chloride 104 101 - 111 mmol/L   BUN 14 6 - 20 mg/dL   Creatinine, Ser 2.13 0.61 - 1.24 mg/dL   Glucose, Bld 72 65 - 99 mg/dL   Calcium, Ion 0.86 (L) 1.15 - 1.40 mmol/L    TCO2 26 0 - 100 mmol/L   Hemoglobin 16.3 13.0 - 17.0 g/dL   HCT 57.8 46.9 - 62.9 %  I-Stat CG4 Lactic Acid, ED  Result Value Ref Range   Lactic Acid, Venous 4.16 (HH) 0.5 -  1.9 mmol/L   Comment NOTIFIED PHYSICIAN    Ct Abdomen Pelvis W Contrast  Result Date: 03/09/2016 CLINICAL DATA:  Epigastric pain.  Vomiting.  Normal bowel movements. EXAM: CT ABDOMEN AND PELVIS WITH CONTRAST TECHNIQUE: Multidetector CT imaging of the abdomen and pelvis was performed using the standard protocol following bolus administration of intravenous contrast. CONTRAST:  100mL ISOVUE-300 IOPAMIDOL (ISOVUE-300) INJECTION 61% COMPARISON:  None. FINDINGS: Lower chest: Lung bases are clear. No effusions. Heart is normal size. Hepatobiliary: No focal hepatic abnormality. Gallbladder unremarkable. Pancreas: No focal abnormality or ductal dilatation. Spleen: No focal abnormality.  Normal size. Adrenals/Urinary Tract: Urinary bladder is irregularly shaped and thick walled. Recommend clinical correlation for prior bladder surgery. Cannot exclude cystitis if this is the patient's native bladder. No hydronephrosis. No renal or adrenal mass. Stomach/Bowel: Large stool burden in the left colon and rectosigmoid colon. Cannot exclude fecal impaction. No evidence of bowel obstruction. Small bowel and stomach decompressed, grossly unremarkable. Vascular/Lymphatic: No evidence of aneurysm or adenopathy. Reproductive: No visible focal abnormality. Other: No free fluid or free air. Musculoskeletal: No acute bony abnormality or focal bone lesion. Posterior spinal rods throughout the visualized thoracolumbar spine. Severe scoliosis. IMPRESSION: Large stool burden in the rectosigmoid colon and left colon. Cannot exclude fecal impaction. Irregularly shaped bladder wall which appears thickens. Recommend clinical correlation for pop prior bladder surgery. Cannot exclude cystitis. Electronically Signed   By: Charlett NoseKevin  Dover M.D.   On: 03/09/2016 16:39     Radiology No results found.  Procedures Procedures (including critical care time)  Medications Ordered in ED Medications - No data to display   Initial Impression / Assessment and Plan / ED Course  I have reviewed the triage vital signs and the nursing notes.  Pertinent labs & imaging results that were available during my care of the patient were reviewed by me and considered in my medical decision making (see chart for details).  Clinical Course    6:10 PM patient is alert and in no distress states she's hungry denies pain anywhere In light of leukocytosis, elevated lactate and urinary tract infection code sepsis called. Antibiotics ordered. IV fluid bolus ordered. Consulted Dr.Garing plan admit patient to telemetry unit Oral potassium supplementation ordered Final Clinical Impressions(s) / ED Diagnoses  Diagnosis #1 sepsis #2 urinary tract infection Final diagnoses:  None  #3 hypokalemia  New Prescriptions New Prescriptions   No medications on file     Doug SouSam Pansie Guggisberg, MD 03/09/16 [id number]    Doug SouSam Kani Jobson, MD 03/09/16 [id number]

## 2016-03-09 NOTE — ED Notes (Signed)
Lab at the bedside 

## 2016-03-09 NOTE — Progress Notes (Signed)
Pharmacy Antibiotic Note  Tonita Phoenixicholas Twilley is a 20 y.o. male admitted on 03/09/2016 with UTI.  Pharmacy has been consulted for Rocephin dosing.  Plan: Rocephin 1gm IV every 24 hours. Dose stable for age, weight, renal function and indication. No pharmacokinetic monitoring needed. Sign off.   Weight: 162 lb 1.6 oz (73.5 kg)  Temp (24hrs), Avg:98.8 F (37.1 C), Min:98.6 F (37 C), Max:99.2 F (37.3 C)   Recent Labs Lab 03/09/16 1507 03/09/16 1740 03/09/16 1741  WBC 24.4*  --   --   CREATININE 0.70 0.70  --   LATICACIDVEN  --   --  4.16*    CrCl cannot be calculated (Unknown ideal weight.).    Allergies  Allergen Reactions  . Latex Rash  . Peanut-Containing Drug Products Rash    Antimicrobials this admission: Rocephin 12/28 >>   Dose adjustments this admission: n/a   Microbiology results: 12/28 BCx:  12/28 UCx:    Thank you for allowing pharmacy to be a part of this patient's care.  Mady GemmaHayes, Jaylon Boylen R 03/09/2016 10:38 PM

## 2016-03-09 NOTE — H&P (Signed)
History and Physical    Patient:  James Kirk [id number] 03/04/1996   Date of Admission:  03/09/2016  PRIMARY CARE PROVIDER (PCP): CLAGGETT,ELIN, PA-C   Outpatient Specialists: none  Patient coming from: home   Chief Complaint: abdominal pain and not feeling well  HPI:  The patient is a 20 yo man with spina bifida and bilateral lower extremity paralysis also cannot sense lower abdomen who presented with abdominal discomfort and not feeling well. He does perform self cath due to neurogenic bladder.  Onset: today. Duration: intermittent. Timing: intermittent Severity: mild to moderate.  Context: does have paralysis and neurogenic bladder.  Location: epigastric and periumbilical Radiation: none. Character/Quality: 3/10, dull ache. Alleviated by: Nothing. Exacerbated by: Nothing. Associated Symptoms: Mild nausea. Vomiting x 1. No diarrhea. Chronic constipation. No fever or chills. Treatments: none at home except usual medications.   ED Course: Patient was found to be tachycardic and had a WBC greater than 20; he met criteria for sepsis. He was given ceftriaxone and started on sepsis protocol. He is being admitted for further management.   Past Medical History:  Diagnosis Date  . Neurogenic bladder 03/09/2016  . Neurogenic bowel 1995  . 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 Scoliosis 1995  . Spinal bifida, closed     Past Surgical History:  Procedure Laterality Date  . BACK SURGERY    . BLADDER SURGERY    . HIP SURGERY    . [place]  02-01-94  . VENTRICULOPERITONEAL SHUNT  1995   and revision 1996    Allergies  Allergen Reactions  . Latex Rash  . Peanut-Containing Drug Products Rash    No current facility-administered medications on file prior to encounter.    Current Outpatient Prescriptions on File Prior to Encounter  Medication Sig Dispense Refill  . cetirizine (ZYRTEC) 10 MG tablet Take 10 mg by mouth daily as needed for  allergies.        Social History   Social History  . Marital status: Single    Spouse name: N/A  . Number of children: N/A  . Years of education: N/A   Occupational History  . Not on file.   Social History Main Topics  . Smoking status: Never Smoker  . Smokeless tobacco: Never Used  . Alcohol use No  . Drug use: No  . Sexual activity: Not on file   Other Topics Concern  . Not on file   Social History Narrative  . No narrative on file    History reviewed. No pertinent family history.   Review of Systems:   ROS: GENERAL: No Fever, chills, or diaphoresis. Positive for fatigue/malaise.  HEENT: No nasal discharge or bleeding. No throat pain or swelling. No eye pain or eye redness.  RESPIRATORY: No cough, wheezing, or shortness of breath.  CARDIOVASCULAR: No chest pain or palpitations.  GI: Abdominal pain, nausea, vomiting, constipation. No diarrhea or bloody stool.  NEUROLOGICAL: No headache or focal weakness.  INTEGUMENT: no rashes, itching, or new lesions.  LYMPHATIC SYSTEM: no lymph node swelling or pain.  MUSCULOSKELETAL: no joint pain or joint swelling.  GENITOURINARY: No dysuria or hematuria.  ENDOCRINE: No polyuria or polydipsia.  HEME: No chronic anemia, bleeding, or easy bruising.   Physical Exam:  Vitals:   03/09/16 1730 03/09/16 1800 03/09/16 1830 03/09/16 1922  BP: 115/71 139/92 148/93 152/86  Pulse: (!) 136 (!) 129 (!) 134 (!) 121  Resp: 26 20 22 16   Temp:      TempSrc:  SpO2: 96% 98% 98% 99%  Weight:        GENERAL: Ill-appearing, well nourished, well-developed.  HEENT: Normocephalic, atraumatic; pupils equal and round. Nares patent, without discharge or bleeding. No oropharyngeal lesions or erythema. Mucous membranes are dry.  NECK: is supple, no masses, trachea midline.  RESPIRATORY: Clear to auscultation bilaterally. Chest wall movements are symmetric. No use of accessory muscles to breathe.  No wheezing, rales,  rhonchi. CARDIOVASCULAR: Normal S1, S2. No rubs, or gallops. PMI non-displaced. Carotids: no carotid bruits.  Tachycardia. DP pulses 2+ bilaterally. Capillary refill less than 3 seconds. GI: soft, non-distended, normal active bowel sounds. No hepatosplenomegaly. Mild tenderness in epigastric and periumbilical areas. INTEGUMENT: Clean, dry, and intact. No rashes. MUSCULOSKELETAL: No cyanosis. No clubbing. Edema: none bilaterally.  NEUROLOGICAL: Cranial nerves 2-12 grossly intact. Reflexes: decreased bilaterally. Babinski: toes non-reactive bilaterally. Motor 5/5 throughout upper extremities and 0/5 in both lower extremities. Sensory grossly intact to light touch in upper extremities, absent in lower extremities. Intact rapid alternating movements bilaterally. No pronator drift.  PSYCHIATRIC: Fully oriented. Normal and appropriate affect.  LYMPHATIC: No cervical lymphadenopathy. No supraclavicular lymphadenopathy.   Labs on Admission: I have personally reviewed following labs and imaging studies.  Results for orders placed or performed during the hospital encounter of 03/09/16 (from the past 24 hour(s))  Urinalysis, Routine w reflex microscopic   Collection Time: 03/09/16  1:02 PM  Result Value Ref Range   Color, Urine YELLOW YELLOW   APPearance TURBID (A) CLEAR   Specific Gravity, Urine 1.020 1.005 - 1.030   pH 6.5 5.0 - 8.0   Glucose, UA NEGATIVE NEGATIVE mg/dL   Hgb urine dipstick TRACE (A) NEGATIVE   Bilirubin Urine NEGATIVE NEGATIVE   Ketones, ur 15 (A) NEGATIVE mg/dL   Protein, ur 30 (A) NEGATIVE mg/dL   Nitrite POSITIVE (A) NEGATIVE   Leukocytes, UA MODERATE (A) NEGATIVE  Urinalysis, Microscopic (reflex)   Collection Time: 03/09/16  1:02 PM  Result Value Ref Range   RBC / HPF 0-5 0 - 5 RBC/hpf   WBC, UA 6-30 0 - 5 WBC/hpf   Bacteria, UA MANY (A) NONE SEEN   Squamous Epithelial / LPF 0-5 (A) NONE SEEN   Mucous PRESENT   Lipase, blood   Collection Time: 03/09/16  3:07 PM   Result Value Ref Range   Lipase 20 11 - 51 U/L  Comprehensive metabolic panel   Collection Time: 03/09/16  3:07 PM  Result Value Ref Range   Sodium 136 135 - 145 mmol/L   Potassium 3.2 (L) 3.5 - 5.1 mmol/L   Chloride 99 (L) 101 - 111 mmol/L   CO2 25 22 - 32 mmol/L   Glucose, Bld 102 (H) 65 - 99 mg/dL   BUN 13 6 - 20 mg/dL   Creatinine, Ser 0.70 0.61 - 1.24 mg/dL   Calcium 9.6 8.9 - 10.3 mg/dL   Total Protein 8.9 (H) 6.5 - 8.1 g/dL   Albumin 4.8 3.5 - 5.0 g/dL   AST 18 15 - 41 U/L   ALT 19 17 - 63 U/L   Alkaline Phosphatase 112 38 - 126 U/L   Total Bilirubin 1.1 0.3 - 1.2 mg/dL   GFR calc non Af Amer >60 >60 mL/min   GFR calc Af Amer >60 >60 mL/min   Anion gap 12 5 - 15  CBC   Collection Time: 03/09/16  3:07 PM  Result Value Ref Range   WBC 24.4 (H) 4.0 - 10.5 K/uL   RBC 5.06 4.22 -  5.81 MIL/uL   Hemoglobin 15.4 13.0 - 17.0 g/dL   HCT 45.1 39.0 - 52.0 %   MCV 89.1 78.0 - 100.0 fL   MCH 30.4 26.0 - 34.0 pg   MCHC 34.1 30.0 - 36.0 g/dL   RDW 13.0 11.5 - 15.5 %   Platelets 133 (L) 150 - 400 K/uL  I-stat chem 8, ed   Collection Time: 03/09/16  5:40 PM  Result Value Ref Range   Sodium 138 135 - 145 mmol/L   Potassium 3.5 3.5 - 5.1 mmol/L   Chloride 104 101 - 111 mmol/L   BUN 14 6 - 20 mg/dL   Creatinine, Ser 0.70 0.61 - 1.24 mg/dL   Glucose, Bld 72 65 - 99 mg/dL   Calcium, Ion 1.09 (L) 1.15 - 1.40 mmol/L   TCO2 26 0 - 100 mmol/L   Hemoglobin 16.3 13.0 - 17.0 g/dL   HCT 48.0 39.0 - 52.0 %  I-Stat CG4 Lactic Acid, ED   Collection Time: 03/09/16  5:41 PM  Result Value Ref Range   Lactic Acid, Venous 4.16 (HH) 0.5 - 1.9 mmol/L   Comment NOTIFIED PHYSICIAN         Radiological Exams on Admission:  CT abdomen and pelvis: IMPRESSION: Large stool burden in the rectosigmoid colon and left colon. Cannot exclude fecal impaction.  Irregularly shaped bladder wall which appears thickens. Recommend clinical correlation for pop prior bladder surgery. Cannot  exclude cystitis.   Assessment/Plan  Diagnoses in order: 1. Sepsis 2. Acute cystitis 3. Neurogenic bladder 4. Paraplegia and other diagnoses as noted below.   VP (ventriculoperitoneal) shunt status Noted.  Sepsis due to undetermined organism [facility]) Present on admission. Source: UTI Plan:  Sepsis order set. Cultures ordered. IV antibiotics. IV fluids to provide volume.  Monitor for signs of volume depletion; monitor blood pressure carefully.  Close monitoring.  If patient has hypotension, give IVF: initial IVF 30 mL/kg x 1, then 250 mL/hr x 1 L, then maintenance IVF.   Acute cystitis With neurogenic bladder. Self-caths. Plan: Cultures. IV ceftriaxone.  Neurogenic bladder Continue in and out caths prn.  Paraplegia, complete ([place]) Continue routine care  Spina bifida [facility]) With paraplegia. Continue routine care.  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 Plan: Trial of Miralax.  Hypokalemia Replace.        DVT prophylaxis: Lovenox.  Code Status:  Full   Family Communication: None Disposition Plan: home Consults called: none Admission status: Inpatient   Patient requires admission due to risks, which include: Death, sepsis, worsening infection, worsening pain and suffering.   Tacey Ruiz MD [facility] Pager [phone]

## 2016-03-09 NOTE — ED Notes (Signed)
Pt going to xray  

## 2016-03-09 NOTE — ED Notes (Signed)
Placed pt on cardiac monitor

## 2016-03-09 NOTE — ED Triage Notes (Signed)
Pt comes in with mother. Pt has spina bifida and was sent here by his PCP to rule out obstruction. Mothers states pt has to use a warm water solution to help him produce a bowel movement. States she is unsure when his last good bowel movement was. Pt had an xray done at the PCP's office which showed an "abnormal gas pattern." They referred him to here.

## 2016-03-10 ENCOUNTER — Encounter (HOSPITAL_COMMUNITY): Payer: Self-pay | Admitting: Internal Medicine

## 2016-03-10 DIAGNOSIS — Z982 Presence of cerebrospinal fluid drainage device: Secondary | ICD-10-CM

## 2016-03-10 DIAGNOSIS — A419 Sepsis, unspecified organism: Principal | ICD-10-CM

## 2016-03-10 HISTORY — DX: Presence of cerebrospinal fluid drainage device: Z98.2

## 2016-03-10 LAB — BASIC METABOLIC PANEL
ANION GAP: 8 (ref 5–15)
BUN: 9 mg/dL (ref 6–20)
CALCIUM: 8 mg/dL — AB (ref 8.9–10.3)
CO2: 24 mmol/L (ref 22–32)
Chloride: 107 mmol/L (ref 101–111)
Creatinine, Ser: 0.69 mg/dL (ref 0.61–1.24)
GFR calc non Af Amer: >60 mL/min (ref >60)
Glucose, Bld: 96 mg/dL (ref 65–99)
Potassium: 3.5 mmol/L (ref 3.5–5.1)
Sodium: 139 mmol/L (ref 135–145)

## 2016-03-10 LAB — CBC
HCT: 36.3 % — ABNORMAL LOW (ref 39.0–52.0)
HEMOGLOBIN: 12.4 g/dL — AB (ref 13.0–17.0)
MCH: 30.5 pg (ref 26.0–34.0)
MCHC: 34.2 g/dL (ref 30.0–36.0)
MCV: 89.2 fL (ref 78.0–100.0)
Platelets: 124 10*3/uL — ABNORMAL LOW (ref 150–400)
RBC: 4.07 MIL/uL — AB (ref 4.22–5.81)
RDW: 13 % (ref 11.5–15.5)
WBC: 14.6 10*3/uL — ABNORMAL HIGH (ref 4.0–10.5)

## 2016-03-10 LAB — PROCALCITONIN: Procalcitonin: <0.1 ng/mL

## 2016-03-10 LAB — LACTIC ACID, PLASMA
Lactic Acid, Venous: 0.6 mmol/L (ref 0.5–1.9)
Lactic Acid, Venous: 0.6 mmol/L (ref 0.5–1.9)

## 2016-03-10 NOTE — Assessment & Plan Note (Signed)
Noted  

## 2016-03-10 NOTE — Care Management Important Message (Signed)
Important Message  Patient Details  Name: James Kirk MRN: [id number] Date of Birth: 24-Jul-1995   Medicare Important Message Given:  Yes    Raymond Bhardwaj, Chrystine OilerSharley Diane, RN 03/10/2016, 4:08 PM

## 2016-03-10 NOTE — Assessment & Plan Note (Signed)
Tried enemas at home. Plan: Trial of Miralax.

## 2016-03-10 NOTE — Assessment & Plan Note (Signed)
Continue routine care

## 2016-03-10 NOTE — Assessment & Plan Note (Signed)
Replace ° °

## 2016-03-10 NOTE — Assessment & Plan Note (Signed)
With paraplegia. Continue routine care.

## 2016-03-10 NOTE — Progress Notes (Signed)
PROGRESS NOTE    James Kirk  WUJ:[id number] DOB: [birth year] DOA: 03/09/2016 PCP: Alleen Borne     Brief Narrative:  20 y/o man admitted from home on 12/28. H/o paraplegia from spina bifida; found to have sepsis due to UTI. Admission requested.   Assessment & Plan:   Principal Problem:   Sepsis due to undetermined organism [facility]) Active Problems:   Acute cystitis   Neurogenic bladder   Paraplegia, complete ([facility])   Spina bifida ([facility])   VP (ventriculoperitoneal) shunt status   Constipation by delayed colonic transit   Hypokalemia   Sepsis -Due to UTI/neurogenic bladder. -Lactic acidosis has cleared, rest of sepsis parameters improved. -Continue rocephin pending cx data.   DVT prophylaxis: lovenox Code Status: full code Family Communication: mother and father at bedside updated on plan of care and all questions answered Disposition Plan: [facility] for DC home in 24-48 hours  Consultants:   None  Procedures:   None  Antimicrobials:  Anti-infectives    Start     Dose/Rate Route Frequency Ordered Stop   03/10/16 1800  cefTRIAXone (ROCEPHIN) 1 g in dextrose 5 % 50 mL IVPB     1 g 100 mL/hr over 30 Minutes Intravenous Every 24 hours 03/09/16 2237     03/09/16 1800  cefTRIAXone (ROCEPHIN) 2 g in dextrose 5 % 50 mL IVPB     2 g 100 mL/hr over 30 Minutes Intravenous  Once 03/09/16 1748 03/09/16 2016   03/09/16 1730  sulfamethoxazole-trimethoprim (BACTRIM DS,SEPTRA DS) 800-160 MG per tablet 1 tablet     1 tablet Oral  Once 03/09/16 1719 03/09/16 1730       Subjective: Feels much better, good spirits  Objective: Vitals:   03/09/16 2058 03/10/16 0458 03/10/16 0631 03/10/16 1400  BP: (!) 146/71  (!) 136/92 (!) 148/84  Pulse: (!) 104  (!) 122 (!) 117  Resp: 16  18 18   Temp: 98.6 F (37 C)  99.5 F (37.5 C) 99.9 F (37.7 C)  TempSrc: Oral  Oral Oral  SpO2: 100%  99% 98%  Weight: 73.5 kg (162 lb 1.6 oz) 73.8 kg (162 lb 11.2 oz)    Height: 5\' 5"  (1.651  m)       Intake/Output Summary (Last 24 hours) at 03/10/16 1530 Last data filed at 03/10/16 1100  Gross per 24 hour  Intake          2931.25 ml  Output                0 ml  Net          2931.25 ml   Filed Weights   03/09/16 1259 03/09/16 2058 03/10/16 0458  Weight: 68 kg (150 lb) 73.5 kg (162 lb 1.6 oz) 73.8 kg (162 lb 11.2 oz)    Examination:  General exam: Alert, awake, oriented x 3 Respiratory system: Clear to auscultation. Respiratory effort normal. Cardiovascular system:RRR. No murmurs, rubs, gallops. Gastrointestinal system: Abdomen is nondistended, soft and nontender. No organomegaly or masses felt. Normal bowel sounds heard. Central nervous system: Alert and oriented. No focal neurological deficits. Extremities: contracted, flaccid paralysis Skin: No rashes, lesions or ulcers Psychiatry: Judgement and insight appear normal. Mood & affect appropriate.     Data Reviewed: I have personally reviewed following labs and imaging studies  CBC:  Recent Labs Lab 03/09/16 1507 03/09/16 1740 03/10/16 0137  WBC 24.4*  --  14.6*  HGB 15.4 16.3 12.4*  HCT 45.1 48.0 36.3*  MCV 89.1  --  89.2  PLT 133*  --  124*   Basic Metabolic Panel:  Recent Labs Lab 03/09/16 1507 03/09/16 1740 03/10/16 0137  NA 136 138 139  K 3.2* 3.5 3.5  CL 99* 104 107  CO2 25  --  24  GLUCOSE 102* 72 96  BUN 13 14 9   CREATININE 0.70 0.70 0.69  CALCIUM 9.6  --  8.0*   GFR: Estimated Creatinine Clearance: 136 mL/min (by C-G formula based on SCr of 0.69 mg/dL). Liver Function Tests:  Recent Labs Lab 03/09/16 1507  AST 18  ALT 19  ALKPHOS 112  BILITOT 1.1  PROT 8.9*  ALBUMIN 4.8    Recent Labs Lab 03/09/16 1507  LIPASE 20   No results for input(s): AMMONIA in the last 168 hours. Coagulation Profile:  Recent Labs Lab 03/09/16 2317  INR 1.33   Cardiac Enzymes: No results for input(s): CKTOTAL, CKMB, CKMBINDEX, TROPONINI in the last 168 hours. BNP (last 3 results) No  results for input(s): PROBNP in the last 8760 hours. HbA1C: No results for input(s): HGBA1C in the last 72 hours. CBG: No results for input(s): GLUCAP in the last 168 hours. Lipid Profile: No results for input(s): CHOL, HDL, LDLCALC, TRIG, CHOLHDL, LDLDIRECT in the last 72 hours. Thyroid Function Tests: No results for input(s): TSH, T4TOTAL, FREET4, T3FREE, THYROIDAB in the last 72 hours. Anemia Panel: No results for input(s): VITAMINB12, FOLATE, FERRITIN, TIBC, IRON, RETICCTPCT in the last 72 hours. Urine analysis:    Component Value Date/Time   COLORURINE YELLOW 03/09/2016 1302   APPEARANCEUR TURBID (A) 03/09/2016 1302   LABSPEC 1.020 03/09/2016 1302   PHURINE 6.5 03/09/2016 1302   GLUCOSEU NEGATIVE 03/09/2016 1302   HGBUR TRACE (A) 03/09/2016 1302   BILIRUBINUR NEGATIVE 03/09/2016 1302   KETONESUR 15 (A) 03/09/2016 1302   PROTEINUR 30 (A) 03/09/2016 1302   UROBILINOGEN 1.0 01/23/2009 1307   NITRITE POSITIVE (A) 03/09/2016 1302   LEUKOCYTESUR MODERATE (A) 03/09/2016 1302   Sepsis Labs: @LABRCNTIP (procalcitonin:4,lacticidven:4)  ) Recent Results (from the past 240 hour(s))  Blood Culture (routine x 2)     Status: None (Preliminary result)   Collection Time: 03/09/16  7:02 PM  Result Value Ref Range Status   Specimen Description RIGHT ANTECUBITAL  Final   Special Requests BOTTLES DRAWN AEROBIC AND ANAEROBIC 6CC  Final   Culture NO GROWTH < 12 HOURS  Final   Report Status PENDING  Incomplete  Blood Culture (routine x 2)     Status: None (Preliminary result)   Collection Time: 03/09/16  7:02 PM  Result Value Ref Range Status   Specimen Description BLOOD RIGHT HAND  Final   Special Requests BOTTLES DRAWN AEROBIC AND ANAEROBIC 6CC  Final   Culture NO GROWTH < 12 HOURS  Final   Report Status PENDING  Incomplete         Radiology Studies: Ct Abdomen Pelvis W Contrast  Result Date: 03/09/2016 CLINICAL DATA:  Epigastric pain.  Vomiting.  Normal bowel movements. EXAM:  CT ABDOMEN AND PELVIS WITH CONTRAST TECHNIQUE: Multidetector CT imaging of the abdomen and pelvis was performed using the standard protocol following bolus administration of intravenous contrast. CONTRAST:  100mL ISOVUE-300 IOPAMIDOL (ISOVUE-300) INJECTION 61% COMPARISON:  None. FINDINGS: Lower chest: Lung bases are clear. No effusions. Heart is normal size. Hepatobiliary: No focal hepatic abnormality. Gallbladder unremarkable. Pancreas: No focal abnormality or ductal dilatation. Spleen: No focal abnormality.  Normal size. Adrenals/Urinary Tract: Urinary bladder is irregularly shaped and thick walled. Recommend clinical correlation for prior bladder surgery.  Cannot exclude cystitis if this is the patient's native bladder. No hydronephrosis. No renal or adrenal mass. Stomach/Bowel: Large stool burden in the left colon and rectosigmoid colon. Cannot exclude fecal impaction. No evidence of bowel obstruction. Small bowel and stomach decompressed, grossly unremarkable. Vascular/Lymphatic: No evidence of aneurysm or adenopathy. Reproductive: No visible focal abnormality. Other: No free fluid or free air. Musculoskeletal: No acute bony abnormality or focal bone lesion. Posterior spinal rods throughout the visualized thoracolumbar spine. Severe scoliosis. IMPRESSION: Large stool burden in the rectosigmoid colon and left colon. Cannot exclude fecal impaction. Irregularly shaped bladder wall which appears thickens. Recommend clinical correlation for pop prior bladder surgery. Cannot exclude cystitis. Electronically Signed   By: Charlett NoseKevin  Dover M.D.   On: 03/09/2016 16:39        Scheduled Meds: . cefTRIAXone (ROCEPHIN)  IV  1 g Intravenous Q24H  . enoxaparin (LOVENOX) injection  40 mg Subcutaneous Q24H  . loratadine  10 mg Oral Daily  . polyethylene glycol  17 g Oral Daily   Continuous Infusions: . sodium chloride 75 mL/hr at 03/10/16 1232     LOS: 1 day    Time spent: 25 minutes. Greater than 50% of this time  was spent in direct contact with the patient coordinating care.     Chaya JanHERNANDEZ ACOSTA,ESTELA, MD [facility] Pager [phone]  If 7PM-7AM, please contact night-coverage [email or web address] Password [email or web address] 03/10/2016, 3:30 PM

## 2016-03-10 NOTE — Assessment & Plan Note (Signed)
Continue in and out caths prn.

## 2016-03-10 NOTE — Assessment & Plan Note (Signed)
With neurogenic bladder. Self-caths. Plan: Cultures. IV ceftriaxone.

## 2016-03-10 NOTE — Assessment & Plan Note (Signed)
Present on admission. Source: UTI Plan:  Sepsis order set. Cultures ordered. IV antibiotics. IV fluids to provide volume.  Monitor for signs of volume depletion; monitor blood pressure carefully.  Close monitoring.  If patient has hypotension, give IVF: initial IVF 30 mL/kg x 1, then 250 mL/hr x 1 L, then maintenance IVF.

## 2016-03-11 MED ORDER — CIPROFLOXACIN HCL 500 MG PO TABS: 500.0000 mg | ORAL_TABLET | Freq: Two times a day (BID) | ORAL | 0 refills | Status: AC

## 2016-03-11 MED ORDER — VISI-FLOW IRRIGATION STARTER KIT: 1.0000 | PACK | 2 refills | Status: AC

## 2016-03-11 NOTE — Discharge Summary (Signed)
Physician Discharge Summary  [place] UUV:[id number] DOB: December 13, 1995 DOA: 03/09/2016  PCP: Geroge Baseman  Admit date: 03/09/2016 Discharge date: 03/11/2016  Time spent: 45 minutes  Recommendations for Outpatient Follow-up:  -Will be discharged home today with a 7 day course of Cipro, advised to follow-up with primary care provider in 2 weeks.   Discharge Diagnoses:  Principal Problem:   Sepsis due to undetermined organism [facility]) Active Problems:   Acute cystitis   Neurogenic bladder   Paraplegia, complete ([place])   Spina bifida ([place])   VP (ventriculoperitoneal) shunt status   Constipation by delayed colonic transit   Hypokalemia   Discharge Condition: Stable and improved  Filed Weights   03/09/16 2058 03/10/16 0458 03/11/16 0519  Weight: 73.5 kg (162 lb 1.6 oz) 73.8 kg (162 lb 11.2 oz) 73.4 kg (161 lb 14.4 oz)    History of present illness:  As per Dr. Paul Half on 12/28: The patient is a 20 yo man with spina bifida and bilateral lower extremity paralysis also cannot sense lower abdomen who presented with abdominal discomfort and not feeling well. He does perform self cath due to neurogenic bladder.  Onset: today. Duration: intermittent. Timing: intermittent Severity: mild to moderate.  Context: does have paralysis and neurogenic bladder.  Location: epigastric and periumbilical Radiation: none. Character/Quality: 3/10, dull ache. Alleviated by: Nothing. Exacerbated by: Nothing. Associated Symptoms: Mild nausea. Vomiting x 1. No diarrhea. Chronic constipation. No fever or chills. Treatments: none at home except usual medications.   ED Course: Patient was found to be tachycardic and had a WBC greater than 20; he met criteria for sepsis. He was given ceftriaxone and started on sepsis protocol. He is being admitted for further management.  Hospital Course:   Sepsis -Due to UTI/neurogenic bladder. -Lactic acidosis has cleared, rest of sepsis parameters  resolved. -Culture with greater than 100,000 colonies of Escherichia coli. -Previous urine culture with pansensitive Escherichia coli, has had good response on Rocephin. Will discharge on 7 days of Cipro to complete treatment.   Procedures:  None   Consultations:  None  Discharge Instructions  Discharge Instructions    Increase activity slowly    Complete by:  As directed      Allergies as of 03/11/2016      Reactions   Latex Rash   Peanut-containing Drug Products Rash      Medication List    TAKE these medications   cetirizine 10 MG tablet Commonly known as:  ZYRTEC Take 10 mg by mouth daily as needed for allergies.   ciprofloxacin 500 MG tablet Commonly known as:  CIPRO Take 1 tablet (500 mg total) by mouth 2 (two) times daily.   oxybutynin 5 MG tablet Commonly known as:  DITROPAN Take 5 mg by mouth 3 (three) times daily.   polyethylene glycol packet Commonly known as:  MIRALAX / GLYCOLAX Take 17 g by mouth daily.   VISI-FLOW IRRIGATION STARTER Kit 1 kit by Does not apply route 2 (two) times a week. Start taking on:  03/13/2016 What changed:  how much to take      Allergies  Allergen Reactions  . Latex Rash  . Peanut-Containing Drug Products Rash   Follow-up Information    CLAGGETT,ELIN, PA-C. Schedule an appointment as soon as possible for a visit in 2 week(s).   Specialty:  Family Medicine Contact information: [address] [place] [address] [phone]            The results of significant diagnostics  from this hospitalization (including imaging, microbiology, ancillary and laboratory) are listed below for reference.    Significant Diagnostic Studies: Ct Abdomen Pelvis W Contrast  Result Date: 03/09/2016 CLINICAL DATA:  Epigastric pain.  Vomiting.  Normal bowel movements. EXAM: CT ABDOMEN AND PELVIS WITH CONTRAST TECHNIQUE: Multidetector CT imaging of the abdomen and pelvis was performed using the standard protocol following  bolus administration of intravenous contrast. CONTRAST:  13m ISOVUE-300 IOPAMIDOL (ISOVUE-300) INJECTION 61% COMPARISON:  None. FINDINGS: Lower chest: Lung bases are clear. No effusions. Heart is normal size. Hepatobiliary: No focal hepatic abnormality. Gallbladder unremarkable. Pancreas: No focal abnormality or ductal dilatation. Spleen: No focal abnormality.  Normal size. Adrenals/Urinary Tract: Urinary bladder is irregularly shaped and thick walled. Recommend clinical correlation for prior bladder surgery. Cannot exclude cystitis if this is the patient's native bladder. No hydronephrosis. No renal or adrenal mass. Stomach/Bowel: Large stool burden in the left colon and rectosigmoid colon. Cannot exclude fecal impaction. No evidence of bowel obstruction. Small bowel and stomach decompressed, grossly unremarkable. Vascular/Lymphatic: No evidence of aneurysm or adenopathy. Reproductive: No visible focal abnormality. Other: No free fluid or free air. Musculoskeletal: No acute bony abnormality or focal bone lesion. Posterior spinal rods throughout the visualized thoracolumbar spine. Severe scoliosis. IMPRESSION: Large stool burden in the rectosigmoid colon and left colon. Cannot exclude fecal impaction. Irregularly shaped bladder wall which appears thickens. Recommend clinical correlation for pop prior bladder surgery. Cannot exclude cystitis. Electronically Signed   By: KRolm BaptiseM.D.   On: 03/09/2016 16:39    Microbiology: Recent Results (from the past 240 hour(s))  Urine culture     Status: Abnormal (Preliminary result)   Collection Time: 03/09/16  2:58 PM  Result Value Ref Range Status   Specimen Description URINE, CATHETERIZED  Final   Special Requests NONE  Final   Culture >=100,000 COLONIES/mL ESCHERICHIA COLI (A)  Final   Report Status PENDING  Incomplete  Blood Culture (routine x 2)     Status: None (Preliminary result)   Collection Time: 03/09/16  7:02 PM  Result Value Ref Range Status    Specimen Description RIGHT ANTECUBITAL  Final   Special Requests BOTTLES DRAWN AEROBIC AND ANAEROBIC 6CC  Final   Culture NO GROWTH 2 DAYS  Final   Report Status PENDING  Incomplete  Blood Culture (routine x 2)     Status: None (Preliminary result)   Collection Time: 03/09/16  7:02 PM  Result Value Ref Range Status   Specimen Description BLOOD RIGHT HAND  Final   Special Requests BOTTLES DRAWN AEROBIC AND ANAEROBIC 6CC  Final   Culture NO GROWTH 2 DAYS  Final   Report Status PENDING  Incomplete     Labs: Basic Metabolic Panel:  Recent Labs Lab 03/09/16 1507 03/09/16 1740 03/10/16 0137  NA 136 138 139  K 3.2* 3.5 3.5  CL 99* 104 107  CO2 25  --  24  GLUCOSE 102* 72 96  BUN 13 14 9   CREATININE 0.70 0.70 0.69  CALCIUM 9.6  --  8.0*   Liver Function Tests:  Recent Labs Lab 03/09/16 1507  AST 18  ALT 19  ALKPHOS 112  BILITOT 1.1  PROT 8.9*  ALBUMIN 4.8    Recent Labs Lab 03/09/16 1507  LIPASE 20   No results for input(s): AMMONIA in the last 168 hours. CBC:  Recent Labs Lab 03/09/16 1507 03/09/16 1740 03/10/16 0137  WBC 24.4*  --  14.6*  HGB 15.4 16.3 12.4*  HCT 45.1 48.0 36.3*  MCV 89.1  --  89.2  PLT 133*  --  124*   Cardiac Enzymes: No results for input(s): CKTOTAL, CKMB, CKMBINDEX, TROPONINI in the last 168 hours. BNP: BNP (last 3 results) No results for input(s): BNP in the last 8760 hours.  ProBNP (last 3 results) No results for input(s): PROBNP in the last 8760 hours.  CBG: No results for input(s): GLUCAP in the last 168 hours.     SignedLelon Frohlich  [facility] Pager: [phone] 03/11/2016, 4:57 PM

## 2016-03-12 LAB — URINE CULTURE: Culture: >=100000 — AB

## 2016-03-14 LAB — CULTURE, BLOOD (ROUTINE X 2)
CULTURE: NO GROWTH
Culture: NO GROWTH

## 2017-03-23 IMAGING — CT CT ABD-PELV W/ CM
2 of 5 series · 15 of 46 positions shown, 17 images · IV contrast (Isovue)
Comparison: None.

CLINICAL DATA: Epigastric pain.  Vomiting.  Normal bowel movements.

EXAM:
CT ABDOMEN AND PELVIS WITH CONTRAST
TECHNIQUE: Multidetector CT imaging of the abdomen and pelvis was performed
using the standard protocol following bolus administration of
intravenous contrast.
CONTRAST:  100mL 3MR66Q-499 IOPAMIDOL (3MR66Q-499) INJECTION 61%

[Series 3: axial st · axial · 0.75mm/px · z∈[-219,+111]mm · 12 of 79 slices shown, 14 images]
[im 7/79  soft-tissue]
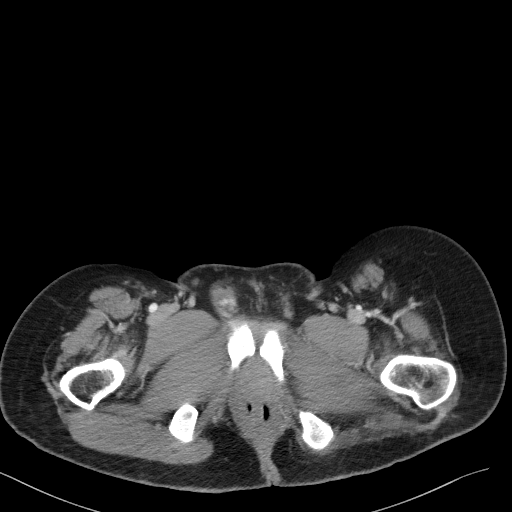
[im 7/79  bone]
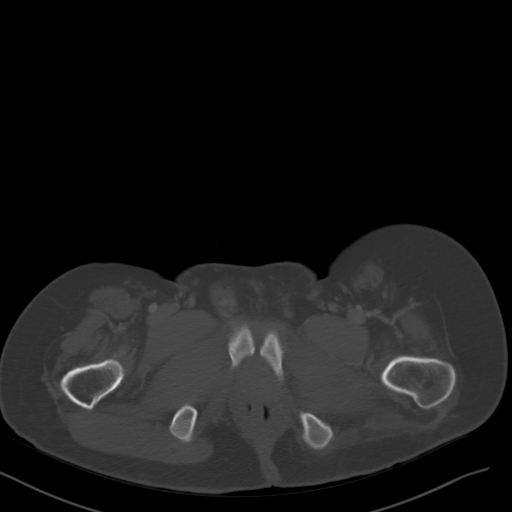
[im 13/79  soft-tissue]
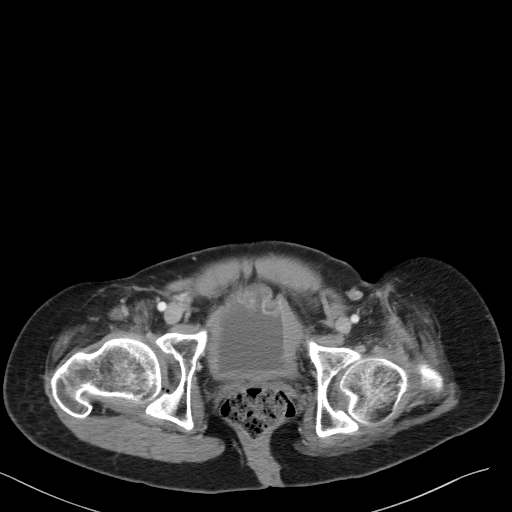
[im 19/79  soft-tissue]
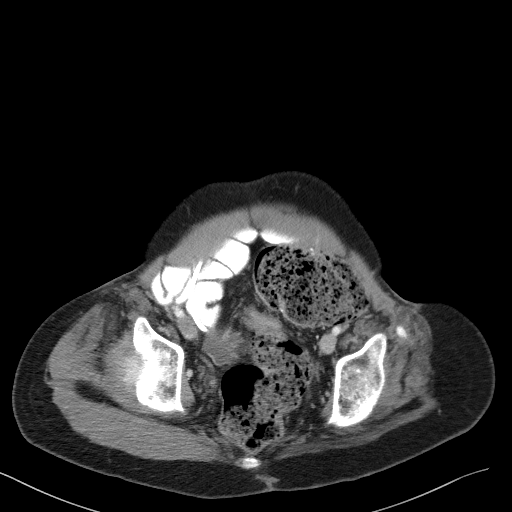
[im 25/79  soft-tissue]
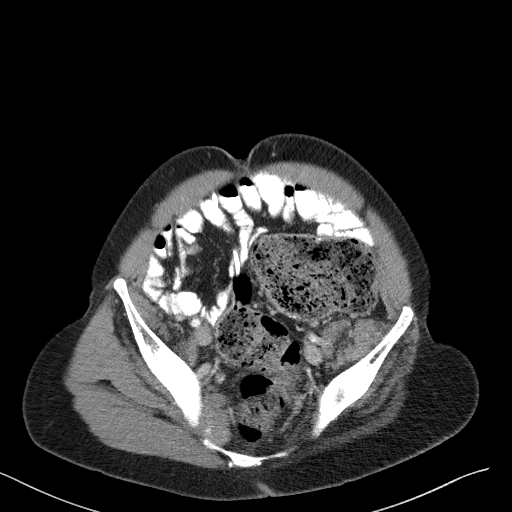
[im 31/79  soft-tissue]
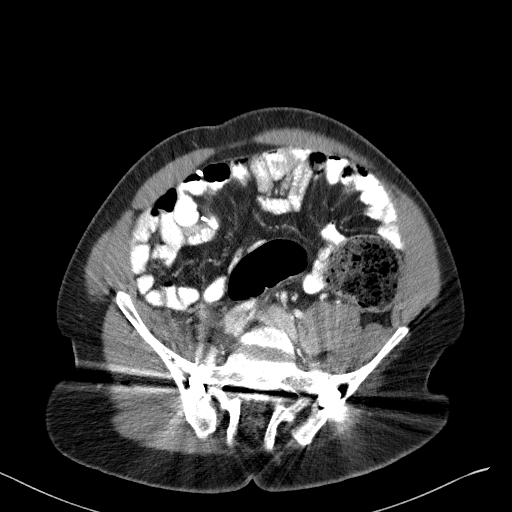
[im 37/79  soft-tissue]
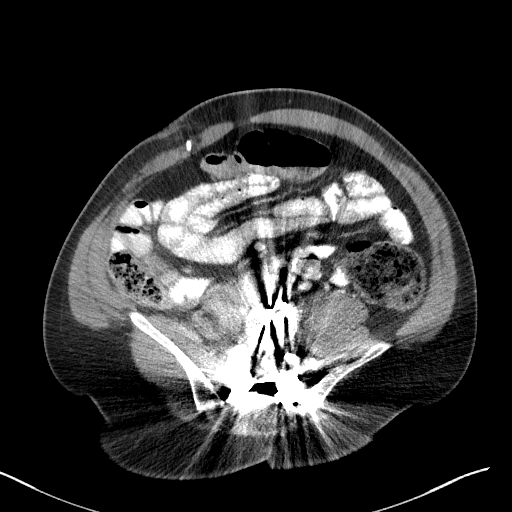
[im 43/79  soft-tissue]
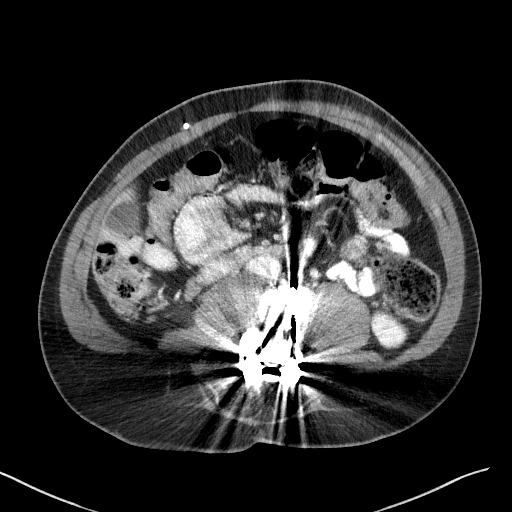
[im 49/79  soft-tissue]
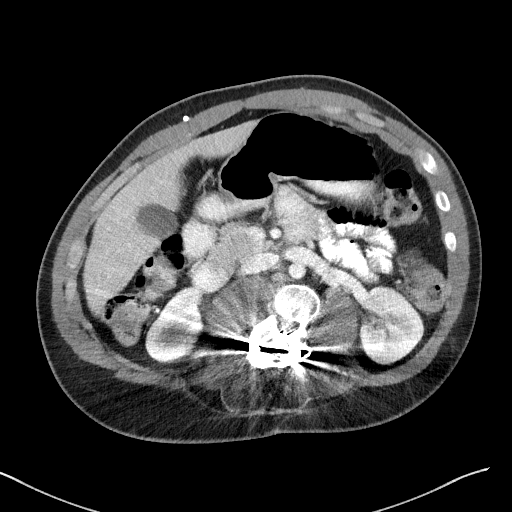
[im 55/79  soft-tissue]
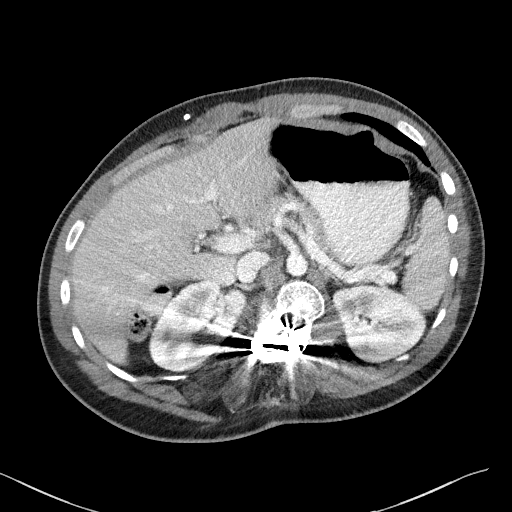
[im 55/79  bone]
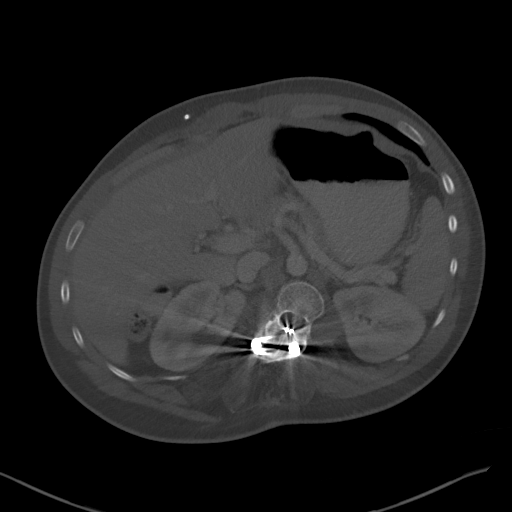
[im 61/79  soft-tissue]
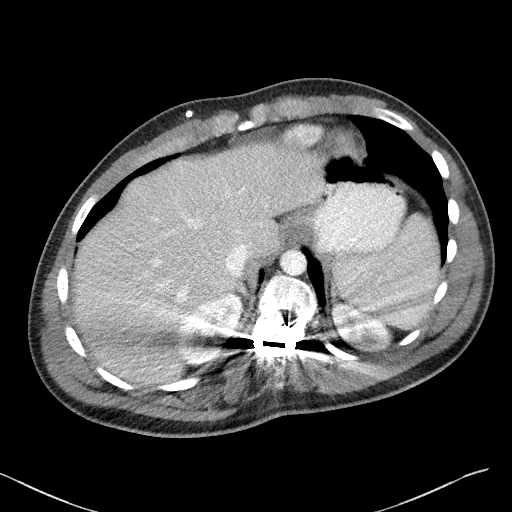
[im 67/79  soft-tissue]
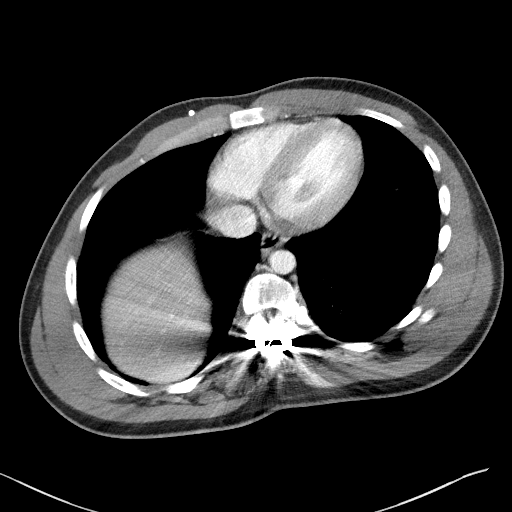
[im 73/79  soft-tissue]
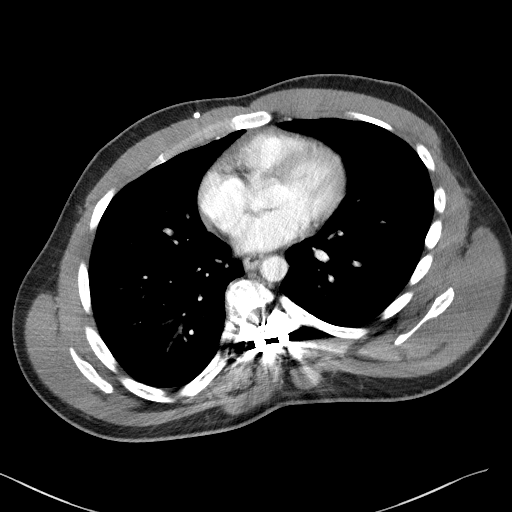

[Series 7: coronal st · coronal · 0.68mm/px · 3 of 103 slices shown]
[im 35/103  soft-tissue]
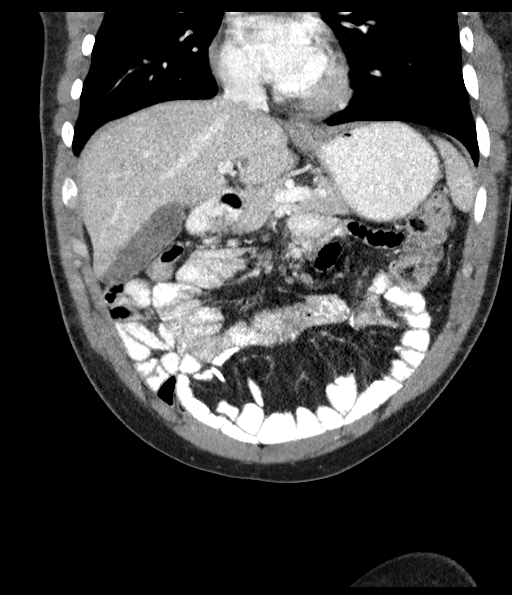
[im 46/103  soft-tissue]
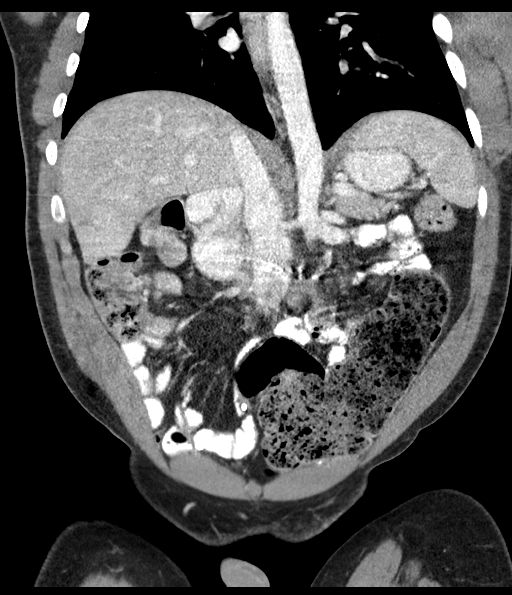
[im 57/103  soft-tissue]
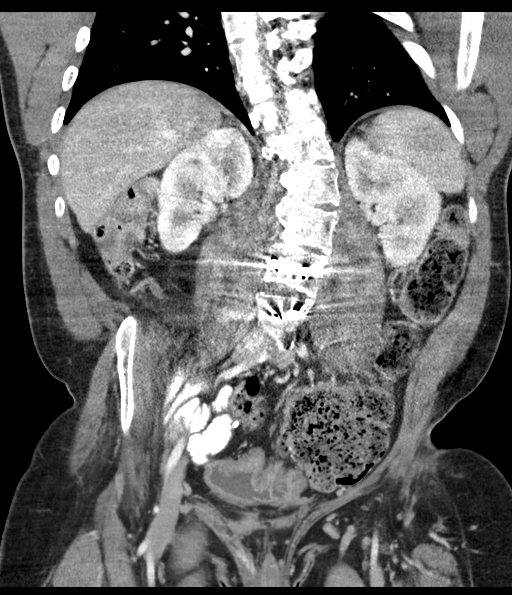

[15 of 46 positions shown; findings below may reference images not displayed]

FINDINGS: Lower chest: Lung bases are clear. No effusions. Heart is normal
size.

Hepatobiliary: No focal hepatic abnormality. Gallbladder
unremarkable.

Pancreas: No focal abnormality or ductal dilatation.

Spleen: No focal abnormality.  Normal size.

Adrenals/Urinary Tract: Urinary bladder is irregularly shaped and
thick walled. Recommend clinical correlation for prior bladder
surgery. Cannot exclude cystitis if this is the patient's native
bladder. No hydronephrosis. No renal or adrenal mass.

Stomach/Bowel: Large stool burden in the left colon and rectosigmoid
colon. Cannot exclude fecal impaction. No evidence of bowel
obstruction. Small bowel and stomach decompressed, grossly
unremarkable.

Vascular/Lymphatic: No evidence of aneurysm or adenopathy.

Reproductive: No visible focal abnormality.

Other: No free fluid or free air.

Musculoskeletal: No acute bony abnormality or focal bone lesion.
Posterior spinal rods throughout the visualized thoracolumbar spine.
Severe scoliosis.
IMPRESSION: Large stool burden in the rectosigmoid colon and left colon. Cannot
exclude fecal impaction.

Irregularly shaped bladder wall which appears thickens. Recommend
clinical correlation for pop prior bladder surgery. Cannot exclude
cystitis.

## 2019-06-02 ENCOUNTER — Telehealth: Payer: Self-pay | Admitting: *Deleted

## 2019-06-02 NOTE — Telephone Encounter (Signed)
error 

## 2021-09-16 DIAGNOSIS — L89309 Pressure ulcer of unspecified buttock, unspecified stage: Secondary | ICD-10-CM | POA: Insufficient documentation

## 2021-09-16 DIAGNOSIS — R159 Full incontinence of feces: Secondary | ICD-10-CM | POA: Insufficient documentation

## 2021-09-16 DIAGNOSIS — I10 Essential (primary) hypertension: Secondary | ICD-10-CM

## 2021-09-16 DIAGNOSIS — J309 Allergic rhinitis, unspecified: Secondary | ICD-10-CM | POA: Insufficient documentation

## 2021-12-18 ENCOUNTER — Emergency Department (HOSPITAL_COMMUNITY)
Admission: EM | Admit: 2021-12-18 | Discharge: 2021-12-18 | Disposition: A | Discharge status: Other Acute Inpatient Hospital | Payer: Medicare Other | Attending: Emergency Medicine | Admitting: Emergency Medicine

## 2021-12-18 ENCOUNTER — Emergency Department (HOSPITAL_COMMUNITY): Payer: Medicare Other

## 2021-12-18 ENCOUNTER — Encounter (HOSPITAL_COMMUNITY): Payer: Self-pay

## 2021-12-18 ENCOUNTER — Other Ambulatory Visit: Payer: Self-pay

## 2021-12-18 DIAGNOSIS — Z91010 Allergy to peanuts: Secondary | ICD-10-CM | POA: Diagnosis not present

## 2021-12-18 DIAGNOSIS — Q054 Unspecified spina bifida with hydrocephalus: Secondary | ICD-10-CM | POA: Insufficient documentation

## 2021-12-18 DIAGNOSIS — Q059 Spina bifida, unspecified: Secondary | ICD-10-CM

## 2021-12-18 DIAGNOSIS — Z91040 Latex allergy status: Secondary | ICD-10-CM | POA: Insufficient documentation

## 2021-12-18 DIAGNOSIS — S72002A Fracture of unspecified part of neck of left femur, initial encounter for closed fracture: Secondary | ICD-10-CM

## 2021-12-18 DIAGNOSIS — S72042A Displaced fracture of base of neck of left femur, initial encounter for closed fracture: Secondary | ICD-10-CM | POA: Insufficient documentation

## 2021-12-18 DIAGNOSIS — Y92009 Unspecified place in unspecified non-institutional (private) residence as the place of occurrence of the external cause: Secondary | ICD-10-CM | POA: Diagnosis not present

## 2021-12-18 DIAGNOSIS — W19XXXA Unspecified fall, initial encounter: Secondary | ICD-10-CM | POA: Diagnosis not present

## 2021-12-18 DIAGNOSIS — S79922A Unspecified injury of left thigh, initial encounter: Secondary | ICD-10-CM | POA: Diagnosis present

## 2021-12-18 MED ORDER — OXYCODONE HCL 5 MG PO TABS
5.0000 mg | ORAL_TABLET | Freq: Once | ORAL | Status: AC
  Administered 2021-12-18: 5 mg via ORAL
  Filled 2021-12-18: qty 1

## 2021-12-18 NOTE — ED Notes (Addendum)
ED TO INPATIENT HANDOFF REPORT  ED Nurse Name and Phone #: [phone]  S Name/Age/Gender James Kirk 26 y.o. male Room/Bed: APA03/APA03  Code Status   Code Status: Prior  Home/SNF/Other Home Patient oriented to: self, place, time, and situation Is this baseline? Yes   Triage Complete: Triage complete  Chief Complaint left side pain  Triage Note C/o fall 2xweeks ago. Pt reports still has hip pain and concerned of injury from the same as hips were not xray at the time of the fall.    Allergies Allergies  Allergen Reactions   Latex Rash   Peanut-Containing Drug Products Rash    Level of Care/Admitting Diagnosis ED Disposition     ED Disposition  Transfer via Transport   Condition  --   Comment  --         B Medical/Surgery History Past Medical History:  Diagnosis Date   Acute cystitis 03/09/2016   Formatting of this note might be different from the original. Last Assessment & Plan: Formatting of this note might be different from the original. With neurogenic bladder. Self-caths. Plan: Cultures. IV ceftriaxone.   Constipation by delayed colonic transit 03/09/2016   Formatting of this note might be different from the original. Last Assessment & Plan: Formatting of this note might be different from the original. Tried enemas at home. Plan: Trial of Miralax.   Essential hypertension 09/16/2021   Hypokalemia 03/09/2016   Formatting of this note might be different from the original. Last Assessment & Plan: Formatting of this note might be different from the original. Replace.   Incontinence of feces 09/16/2021   Lumbar spina bifida with hydrocephalus (HCC) 01/09/2011   Formatting of this note might be different from the original. Last Assessment & Plan: Formatting of this note might be different from the original. With paraplegia. Continue routine care.   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   Spinal bifida, closed    VP (ventriculoperitoneal) shunt status 03/10/2016   Past Surgical History:  Procedure Laterality Date   BACK SURGERY     BLADDER SURGERY     HIP SURGERY     MYELOMININGOCELE REPAIR  May 08, 1993   VENTRICULOPERITONEAL SHUNT  1995   and revision 1996     A IV Location/Drains/Wounds Patient Lines/Drains/Airways Status     Active Line/Drains/Airways     Name Placement date Placement time Site Days   Peripheral IV 12/18/21 22 G 1" Right Hand 12/18/21  1004  Hand  less than 1            Intake/Output Last 24 hours No intake or output data in the 24 hours ending 12/18/21 1005  Labs/Imaging No results found for this or any previous visit (from the past 48 hour(s)). CT FEMUR LEFT WO CONTRAST  Result Date: 12/18/2021 CLINICAL DATA:  Status post fall 2 weeks ago.  Hip fracture. EXAM: CT OF THE LOWER LEFT EXTREMITY WITHOUT CONTRAST TECHNIQUE: Multidetector CT imaging of the left thigh was performed according to the standard protocol. RADIATION DOSE REDUCTION: This exam was performed according to the departmental dose-optimization program which includes automated exposure control, adjustment of the mA and/or kV according to patient size and/or use of iterative reconstruction technique. COMPARISON:  Hip radiographs 12/18/2021. Abdominopelvic CT 03/09/2016. FINDINGS: Bones/Joint/Cartilage Comminuted basicervical fracture of the left femoral neck with extension into  the greater trochanter. There is up to 11 mm a fracture displacement anteriorly within the greater trochanter. The fracture is mildly impacted with resulting varus angulation. No involvement of the femoral head or sub trochanteric region is demonstrated. The femoral head is located. However, there is a moderate-sized joint effusion with intra-articular fracture fragments, greatest posteriorly. No  evidence of acetabular fracture. The distal femur is intact. No significant abnormality identified at the left knee. Ligaments Suboptimally assessed by CT. Muscles and Tendons No acute musculotendinous findings are identified. There is diffuse muscular atrophy within the left thigh, especially within the quadriceps and hamstring musculature. The extensor mechanism appears normal at the knee. Soft tissues Soft tissue swelling about the proximal femur fracture without focal hematoma or foreign body. Chronic bladder wall thickening and trabeculation and prominent stool throughout the colon are noted, similar to previous pelvic CT. IMPRESSION: 1. Comminuted and mildly displaced basicervical fracture of the left femoral neck with extension into the greater trochanter. 2. Associated joint effusion with intra-articular fracture fragments. 3. No evidence of acetabular fracture. 4. Chronic bladder wall thickening and trabeculation. Electronically Signed   By: Richardean Sale M.D.   On: 12/18/2021 09:42   DG Hip Unilat W or Wo Pelvis 2-3 Views Left  Result Date: 12/18/2021 CLINICAL DATA:  Fall injury. EXAM: DG HIP (WITH OR WITHOUT PELVIS) 2-3V LEFT COMPARISON:  CT abdomen and pelvis and reconstructions 03/09/2016 FINDINGS: There is an acute transverse fracture of the distal neck of the proximal left femur, with the distal fragment showing mild cephalad displacement and valgus angulation. No other fractures are seen. Congenital hip dysplasia with steepened acetabula again is noted with old fracture fixation plating again seen in the proximal right femoral shaft. Bilateral metallic sacroiliac bolts with perihardware lucencies appear similar to the previous exam. 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 There is a small heterotopic bone formation medial to proximal right femoral shaft. IMPRESSION: Transverse distal left femoral neck fracture with cephalad displacement and mild valgus angulation of the distal  fragment. Additional chronic findings noted in 2017. Electronically Signed   By: Telford Nab M.D.   On: 12/18/2021 07:36    Pending Labs Unresulted Labs (From admission, onward)    None       Vitals/Pain Today's Vitals   12/18/21 0800 12/18/21 0830 12/18/21 0855 12/18/21 0930  BP: (!) 136/100 (!) 147/91  (!) 143/76  Pulse: 96 87  (!) 108  Resp: 16 16    Temp:      TempSrc:      SpO2: 100% 98%  96%  Weight:      Height:      PainSc:   0-No pain     Isolation Precautions No active isolations  Medications Medications  oxyCODONE (Oxy IR/ROXICODONE) immediate release tablet 5 mg (5 mg Oral Given 12/18/21 0727)    Mobility High fall risk   Focused Assessments     R Recommendations: See Admitting Provider Note  Report given to:   Additional Notes:

## 2021-12-18 NOTE — ED Notes (Signed)
ED Provider at bedside. 

## 2021-12-18 NOTE — ED Notes (Signed)
Patient transported to X-ray 

## 2021-12-18 NOTE — ED Notes (Signed)
Patient transported to CT 

## 2021-12-18 NOTE — ED Triage Notes (Signed)
C/o fall 2xweeks ago. Pt reports still has hip pain and concerned of injury from the same as hips were not xray at the time of the fall.

## 2021-12-18 NOTE — ED Provider Notes (Signed)
[facility] EMERGENCY DEPARTMENT Provider Note   CSN: [id number] Arrival date & time: 12/18/21  0175     History {Add pertinent medical, surgical, social history, OB history to HPI:1} No chief complaint on file.   James Kirk is a 26 y.o. male.  Patient is a 26 year old male with past medical history of spina bifida presenting for hip pain after falling 2 weeks ago.  Patient was originally seen in the emergency department however did not receive x-rays of the hips.  Patient denies any sensation or motor deficits at this time.  States his gait has been impaired due to pain.  Patient was discharged home originally from ED with naproxen prescription.  Does not take any narcotics at the house.  The history is provided by the patient. No language interpreter was used.       Home Medications Prior to Admission medications   Medication Sig Start Date End Date Taking? Authorizing Provider  amLODipine (NORVASC) 2.5 MG tablet  01/21/18  Yes [provider]  ciprofloxacin (CIPRO) 500 MG tablet 1 tablet Orally every 12 hrs for 7 days 07/21/21  Yes [provider]  losartan-hydrochlorothiazide (HYZAAR) 50-12.5 MG tablet 1/2 tablet Orally Once a day for 90 days 04/07/20  Yes [provider]  [place]. 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 12/17/18  Yes [provider]  senna (SENOKOT) 8.6 MG tablet 2 tablets at bedtime as needed Orally Once a day for 30 day(s) 03/21/18  Yes [provider]  triamcinolone ointment (KENALOG) 0.1 % 1 application Externally Twice a day for 30 day(s) 12/15/19  Yes [provider]  amLODipine (NORVASC) 10 MG tablet TAKE 1 TABLET BY MOUTH ONCE DAILY. for 90 days    [provider]  cetirizine (ZYRTEC) 10 MG tablet Take 10 mg by mouth daily as needed for allergies.     [provider]  ciprofloxacin (CIPRO) 500 MG tablet Take 1 tablet (500 mg total) by mouth 2 (two) times daily. 03/11/16   Isaac Bliss, Rayford Halsted, MD  EPINEPHrine (EPIPEN 2-PAK) 0.3 mg/0.3 mL IJ SOAJ injection Injection    [provider]  Ostomy Supplies (VISI-FLOW IRRIGATION STARTER) KIT 1 kit by Does not apply route 2 (two) times a week. 03/13/16   Isaac Bliss, Rayford Halsted, MD  oxybutynin (DITROPAN) 5 MG tablet Take 5 mg by mouth 3 (three) times daily.  02/28/16   [provider]  polyethylene glycol (MIRALAX / GLYCOLAX) packet Take 17 g by mouth daily. 03/09/16   Orlie Dakin, MD      Allergies    Latex and Peanut-containing drug products    Review of Systems   Review of Systems  Constitutional:  Negative for chills and fever.  HENT:  Negative for ear pain and sore throat.   Eyes:  Negative for pain and visual disturbance.  Respiratory:  Negative for cough and shortness of breath.   Cardiovascular:  Negative for chest pain and palpitations.  Gastrointestinal:  Negative for abdominal pain and vomiting.  Genitourinary:  Negative for dysuria and hematuria.  Musculoskeletal:  Negative for arthralgias and back pain.  Skin:  Negative for color change and rash.  Neurological:  Negative for seizures and syncope.  All other systems reviewed and are negative.   Physical Exam Updated Vital Signs BP (!) 148/88   Pulse 91   Temp 97.8 F (36.6 C) (Oral)   Resp 18   Ht $R'5\' 5"'hn$  (1.651 m)   Wt 77.1 kg   SpO2 100%   BMI 28.29 kg/m  Physical Exam Vitals and nursing note reviewed.  Constitutional:      General: He is not in acute distress.    Appearance: He is well-developed.  HENT:     Head: Normocephalic and atraumatic.  Eyes:      Conjunctiva/sclera: Conjunctivae normal.  Cardiovascular:     Rate and Rhythm: Normal rate and regular rhythm.     Pulses:          Dorsalis pedis pulses are 2+ on the right side and 2+ on the left side.     Heart sounds: No murmur heard. Pulmonary:     Effort: Pulmonary effort is normal. No respiratory distress.     Breath sounds: Normal breath sounds.  Abdominal:     Palpations: Abdomen is soft.     Tenderness: There is no abdominal tenderness.  Musculoskeletal:        General: No swelling.     Cervical back: Neck supple. No bony tenderness.     Thoracic back: No bony tenderness.     Lumbar back: Bony tenderness present.     Right hip: No deformity, lacerations or bony tenderness.     Left hip: No deformity, lacerations or bony tenderness.     Right upper leg: No swelling, deformity or bony tenderness.     Left upper leg: No swelling, deformity or bony tenderness.     Right knee: No swelling, deformity, lacerations or bony tenderness.     Left knee: No swelling, deformity, lacerations or bony tenderness.     Right lower leg: No bony tenderness.     Left lower leg: No bony tenderness.     Right ankle: No tenderness.     Left ankle: No tenderness.     Comments: Tenderness to palpation of coccyx. No gross deformities. No ecchymosis or swelling.   Skin:    General: Skin is warm and dry.     Capillary Refill: Capillary refill takes less than 2 seconds.  Neurological:     Mental Status: He is alert.     GCS: GCS eye subscore is 4. GCS verbal subscore is 5. GCS motor subscore is 6.     Sensory: Sensation is intact.     Motor: Motor function is intact.  Psychiatric:        Mood and Affect: Mood normal.     ED Results / Procedures / Treatments   Labs (all labs ordered are listed, but only abnormal results are displayed) Labs Reviewed - No data to display  EKG None  Radiology DG Hip Unilat W or Wo Pelvis 2-3 Views Left  Result Date: 12/18/2021 CLINICAL DATA:  Fall injury.  EXAM: DG HIP (WITH OR WITHOUT PELVIS) 2-3V LEFT COMPARISON:  CT abdomen and pelvis and reconstructions 03/09/2016 FINDINGS: There is an acute transverse fracture of the distal neck of the proximal left femur, with the distal fragment showing mild cephalad displacement and valgus angulation. No other fractures are seen. Congenital hip dysplasia with steepened acetabula again is noted with old fracture fixation plating again seen in the proximal right femoral shaft. Bilateral metallic sacroiliac  bolts with perihardware lucencies appear similar to the previous exam. 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 There is a small heterotopic bone formation medial to proximal right femoral shaft. IMPRESSION: Transverse distal left femoral neck fracture with cephalad displacement and mild valgus angulation of the distal fragment. Additional chronic findings noted in 2017. Electronically Signed   By: Telford Nab M.D.   On: 12/18/2021 07:36    Procedures Procedures  {Document cardiac monitor, telemetry assessment procedure when appropriate:1}  Medications Ordered in ED Medications  oxyCODONE (Oxy IR/ROXICODONE) immediate release tablet 5 mg (5 mg Oral Given 12/18/21 8295)    ED Course/ Medical Decision Making/ A&P                           Medical Decision Making Amount and/or Complexity of Data Reviewed Radiology: ordered.  Risk Prescription drug management.   33:17 AM 26 year old male with past medical history of spina bifida presenting for hip pain after falling 2 weeks ago.  Patient is alert and oriented x3, no acute distress, afebrile, stable vital signs.  No physical evidence of new gross deformities, ecchymosis, swelling, or lacerations.  Patient does have tenderness over the coccyx only.  Otherwise no spinal tenderness.  No tenderness of the femur, knee, or lower extremity.  Compartments are soft.  Patient does endorse pain on palpation of the piriformis muscle that he states since pain  radiating down the back of his thigh.  Oxycodone 5 mg given for pain management.  Differential diagnosis includes but is not limited to piriformis syndrome, sciatica, fracture of the coccyx, hip/pelvic/femur fracture.  X-ray of the left hip and pelvis demonstrates: Transverse distal left femoral neck fracture with cephalad displacement and mild valgus angulation of the distal fragment    {Document critical care time when appropriate:1} {Document review of labs and clinical decision tools ie heart score, Chads2Vasc2 etc:1}  {Document your independent review of radiology images, and any outside records:1} {Document your discussion with family members, caretakers, and with consultants:1} {Document social determinants of health affecting pt's care:1} {Document your decision making why or why not admission, treatments were needed:1} Final Clinical Impression(s) / ED Diagnoses Final diagnoses:  Left displaced femoral neck fracture ([place])    Rx / DC Orders ED Discharge Orders     None
